# Patient Record
Sex: Female | Born: 1965 | Race: Black or African American | Hispanic: No | Marital: Single | Smoking: Never smoker
Health system: Southern US, Community
[De-identification: ages and names within clinical notes are randomized; demographics above are authoritative.]

## PROBLEM LIST (undated history)

## (undated) DIAGNOSIS — E119 Type 2 diabetes mellitus without complications: Secondary | ICD-10-CM

## (undated) DIAGNOSIS — E785 Hyperlipidemia, unspecified: Secondary | ICD-10-CM

## (undated) DIAGNOSIS — Z85528 Personal history of other malignant neoplasm of kidney: Secondary | ICD-10-CM

## (undated) DIAGNOSIS — N182 Chronic kidney disease, stage 2 (mild): Secondary | ICD-10-CM

## (undated) DIAGNOSIS — I10 Essential (primary) hypertension: Secondary | ICD-10-CM

## (undated) DIAGNOSIS — M1990 Unspecified osteoarthritis, unspecified site: Secondary | ICD-10-CM

## (undated) HISTORY — DX: Chronic kidney disease, stage 2 (mild): N18.2

## (undated) HISTORY — PX: ABDOMINAL HYSTERECTOMY: SHX81

## (undated) HISTORY — DX: Hyperlipidemia, unspecified: E78.5

## (undated) HISTORY — PX: CARPAL TUNNEL RELEASE: SHX101

## (undated) HISTORY — DX: Personal history of other malignant neoplasm of kidney: Z85.528

---

## 2007-12-02 ENCOUNTER — Ambulatory Visit (HOSPITAL_COMMUNITY): Admission: RE | Admit: 2007-12-02 | Discharge: 2007-12-02 | Payer: Self-pay | Admitting: Family Medicine

## 2011-12-09 ENCOUNTER — Other Ambulatory Visit (HOSPITAL_COMMUNITY): Payer: Self-pay | Admitting: Physician Assistant

## 2011-12-09 DIAGNOSIS — Z139 Encounter for screening, unspecified: Secondary | ICD-10-CM

## 2011-12-30 ENCOUNTER — Ambulatory Visit (HOSPITAL_COMMUNITY)
Admission: RE | Admit: 2011-12-30 | Discharge: 2011-12-30 | Disposition: A | Discharge status: Home / Self Care | Payer: Self-pay | Source: Ambulatory Visit | Attending: Physician Assistant | Admitting: Physician Assistant

## 2011-12-30 DIAGNOSIS — Z139 Encounter for screening, unspecified: Secondary | ICD-10-CM

## 2012-01-03 ENCOUNTER — Other Ambulatory Visit: Payer: Self-pay | Admitting: Physician Assistant

## 2012-01-03 DIAGNOSIS — R928 Other abnormal and inconclusive findings on diagnostic imaging of breast: Secondary | ICD-10-CM

## 2012-01-24 ENCOUNTER — Other Ambulatory Visit (HOSPITAL_COMMUNITY): Payer: Self-pay | Admitting: Nurse Practitioner

## 2012-01-24 ENCOUNTER — Ambulatory Visit (HOSPITAL_COMMUNITY)
Admission: RE | Admit: 2012-01-24 | Discharge: 2012-01-24 | Disposition: A | Discharge status: Home / Self Care | Payer: PRIVATE HEALTH INSURANCE | Source: Ambulatory Visit | Attending: Physician Assistant | Admitting: Physician Assistant

## 2012-01-24 ENCOUNTER — Ambulatory Visit (HOSPITAL_COMMUNITY)
Admission: RE | Admit: 2012-01-24 | Discharge: 2012-01-24 | Disposition: A | Discharge status: Home / Self Care | Payer: PRIVATE HEALTH INSURANCE | Source: Ambulatory Visit | Attending: Family Medicine | Admitting: Family Medicine

## 2012-01-24 DIAGNOSIS — R928 Other abnormal and inconclusive findings on diagnostic imaging of breast: Secondary | ICD-10-CM

## 2012-01-24 DIAGNOSIS — N6009 Solitary cyst of unspecified breast: Secondary | ICD-10-CM | POA: Insufficient documentation

## 2012-02-05 ENCOUNTER — Encounter (HOSPITAL_COMMUNITY): Payer: Self-pay

## 2013-01-08 IMAGING — MG MM DIGITAL DIAGNOSTIC UNILAT L
2 series · 2 of 2 positions shown · non-contrast
Comparison: With prior mammograms dated 12/30/2011 and 12/02/2007

CLINICAL DATA: Abnormal left screening mammogram

DIGITAL DIAGNOSTIC LEFT MAMMOGRAM  AND LEFT BREAST ULTRASOUND:

[L CC]
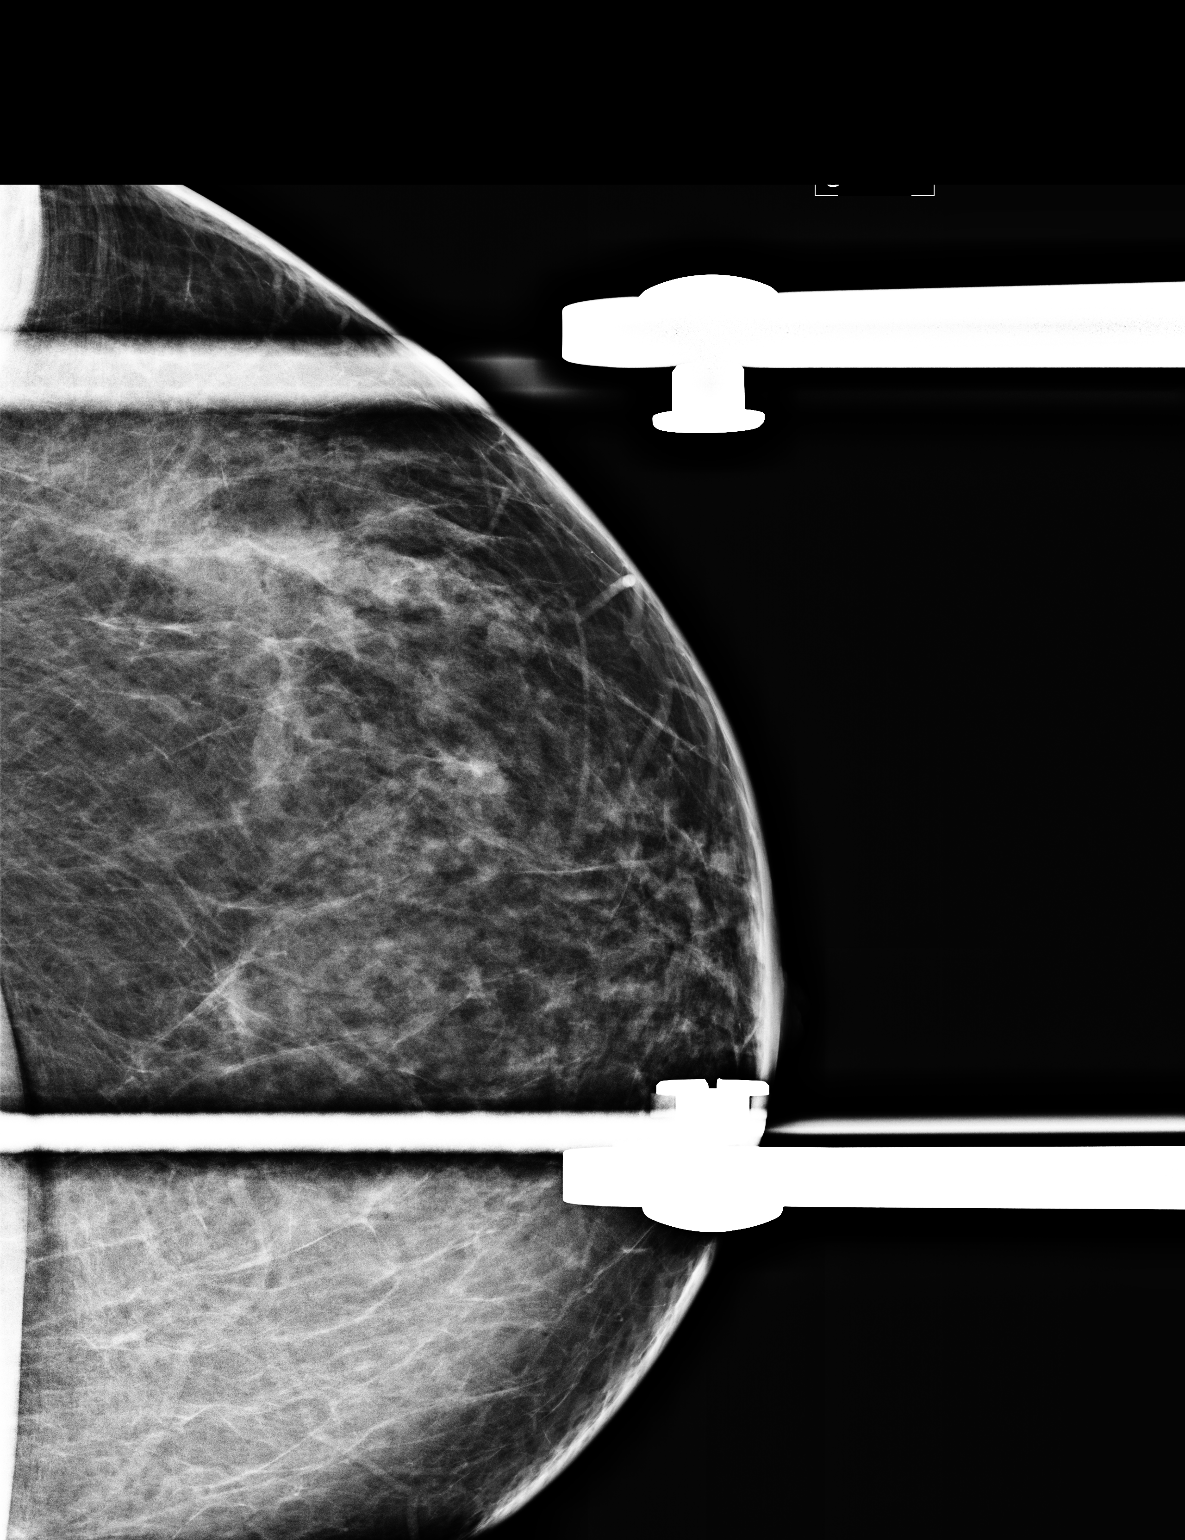

[L MLO]
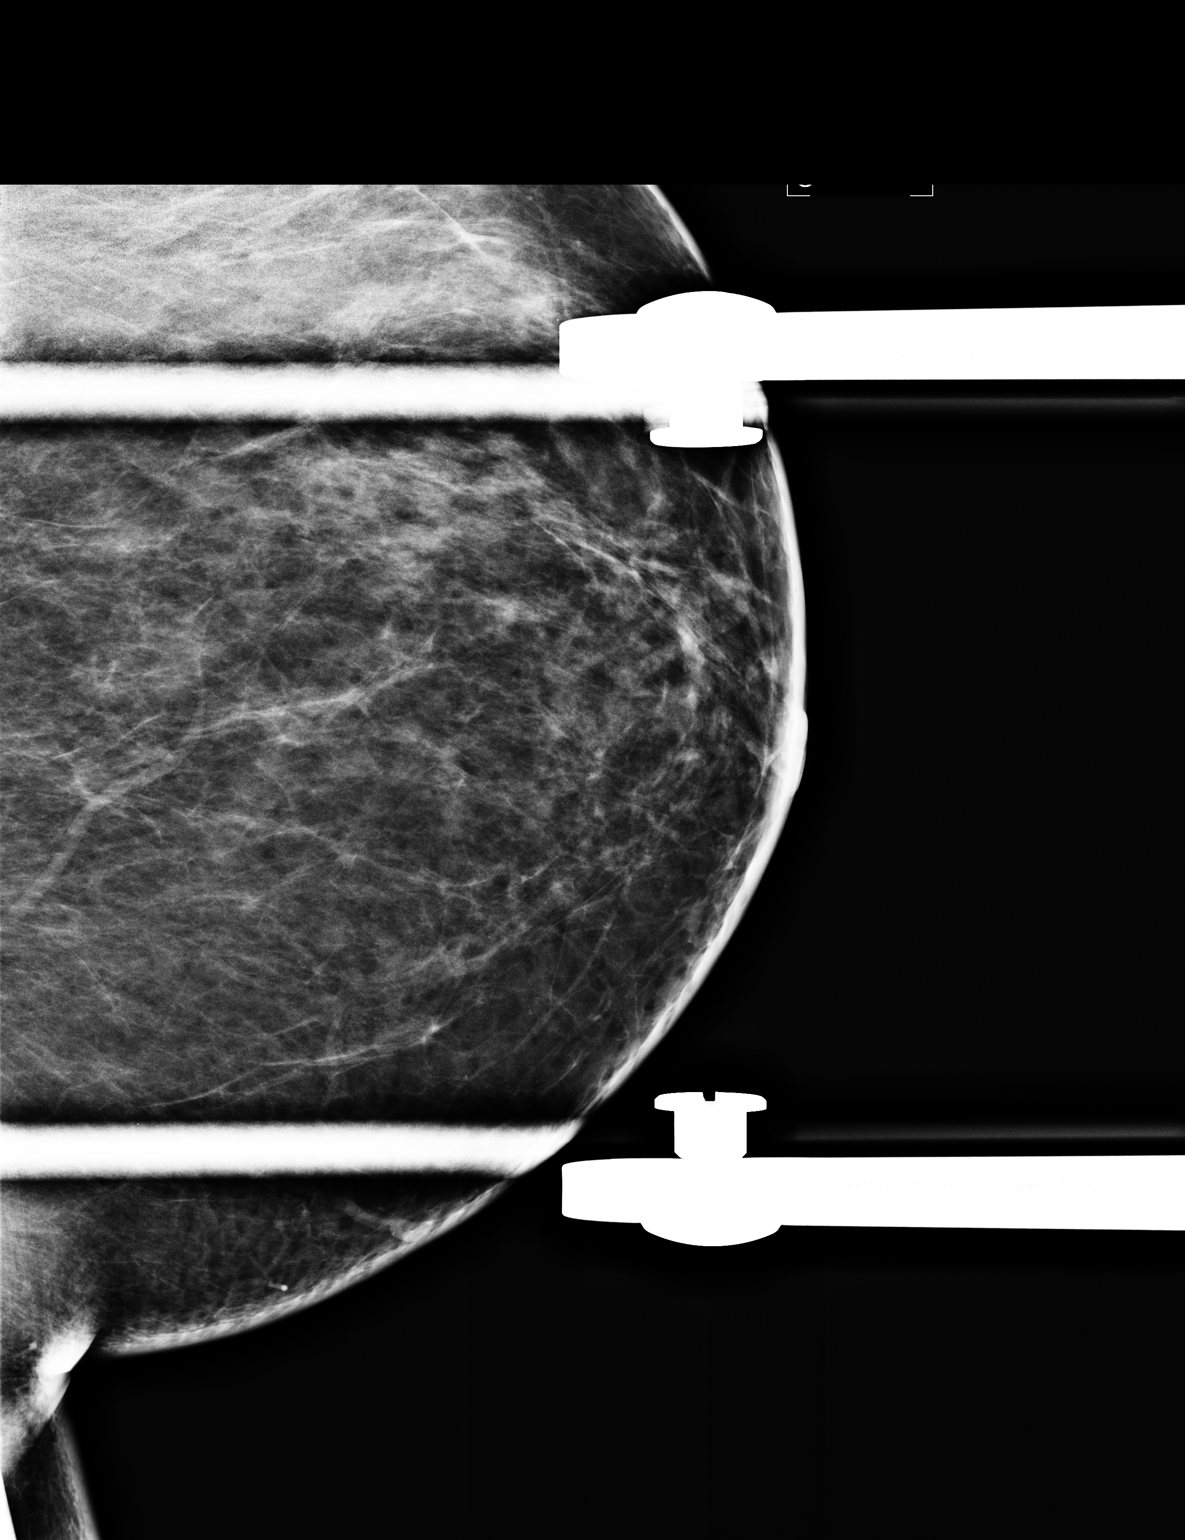

[2 of 2 positions shown; findings below may reference images not displayed]

FINDINGS: ACR Breast Density Category 2: There is a scattered fibroglandular
pattern.

Spot compression views of the lateral portion of the left breast
were obtained.  There is no persistent mass, distortion or
malignant-type microcalcifications.

On physical exam, I do not palpate a mass in the left breast.

Ultrasound is performed, showing there is a simple cyst in the left
breast at 9 o'clock 7 cm from the nipple measuring 5 x 4 x 7 mm.
There is no solid mass.
IMPRESSION: Benign-appearing cyst in the left breast.  No evidence of
malignancy.

RECOMMENDATION:
Bilateral screening mammogram in 1 year is recommended.

I have discussed the findings and recommendations with the patient.
Results were also provided in writing at the conclusion of the
visit.

BI-RADS CATEGORY 2:  Benign finding(s).

## 2014-04-13 ENCOUNTER — Encounter (HOSPITAL_COMMUNITY): Payer: Self-pay | Admitting: *Deleted

## 2014-04-13 ENCOUNTER — Emergency Department (HOSPITAL_COMMUNITY)
Admission: EM | Admit: 2014-04-13 | Discharge: 2014-04-13 | Disposition: A | Discharge status: Home / Self Care | Payer: Self-pay | Attending: Emergency Medicine | Admitting: Emergency Medicine

## 2014-04-13 DIAGNOSIS — I10 Essential (primary) hypertension: Secondary | ICD-10-CM | POA: Insufficient documentation

## 2014-04-13 DIAGNOSIS — R739 Hyperglycemia, unspecified: Secondary | ICD-10-CM

## 2014-04-13 DIAGNOSIS — R42 Dizziness and giddiness: Secondary | ICD-10-CM | POA: Insufficient documentation

## 2014-04-13 DIAGNOSIS — E1165 Type 2 diabetes mellitus with hyperglycemia: Secondary | ICD-10-CM | POA: Insufficient documentation

## 2014-04-13 HISTORY — DX: Type 2 diabetes mellitus without complications: E11.9

## 2014-04-13 HISTORY — DX: Essential (primary) hypertension: I10

## 2014-04-13 LAB — BASIC METABOLIC PANEL
Anion gap: 5 (ref 5–15)
BUN: 13 mg/dL (ref 6–23)
CO2: 26 mmol/L (ref 19–32)
Calcium: 9.4 mg/dL (ref 8.4–10.5)
Chloride: 104 mmol/L (ref 96–112)
Creatinine, Ser: 0.8 mg/dL (ref 0.50–1.10)
GFR calc Af Amer: >90 mL/min (ref >90)
GFR calc non Af Amer: 85 mL/min — ABNORMAL LOW (ref >90)
Glucose, Bld: 251 mg/dL — ABNORMAL HIGH (ref 70–99)
Potassium: 3.8 mmol/L (ref 3.5–5.1)
Sodium: 135 mmol/L (ref 135–145)

## 2014-04-13 LAB — CBC
HCT: 36.1 % (ref 36.0–46.0)
Hemoglobin: 11.7 g/dL — ABNORMAL LOW (ref 12.0–15.0)
MCH: 26.8 pg (ref 26.0–34.0)
MCHC: 32.4 g/dL (ref 30.0–36.0)
MCV: 82.8 fL (ref 78.0–100.0)
Platelets: 192 10*3/uL (ref 150–400)
RBC: 4.36 MIL/uL (ref 3.87–5.11)
RDW: 13.9 % (ref 11.5–15.5)
WBC: 7.3 10*3/uL (ref 4.0–10.5)

## 2014-04-13 MED ORDER — LORAZEPAM 1 MG PO TABS
1.0000 mg | ORAL_TABLET | Freq: Once | ORAL | Status: AC
  Administered 2014-04-13: 1 mg via ORAL
  Filled 2014-04-13: qty 1

## 2014-04-13 MED ORDER — SODIUM CHLORIDE 0.9 % IV BOLUS (SEPSIS)
1000.0000 mL | Freq: Once | INTRAVENOUS | Status: AC
  Administered 2014-04-13: 1000 mL via INTRAVENOUS

## 2014-04-13 MED ORDER — LORAZEPAM 1 MG PO TABS: 1.0000 mg | ORAL_TABLET | Freq: Once | ORAL | Status: DC

## 2014-04-13 MED ORDER — MECLIZINE HCL 25 MG PO TABS: 25.0000 mg | ORAL_TABLET | Freq: Three times a day (TID) | ORAL | Status: DC | PRN

## 2014-04-13 MED ORDER — MECLIZINE HCL 12.5 MG PO TABS
25.0000 mg | ORAL_TABLET | Freq: Once | ORAL | Status: AC
  Administered 2014-04-13: 25 mg via ORAL
  Filled 2014-04-13: qty 2

## 2014-04-13 MED ORDER — ONDANSETRON 8 MG PO TBDP: 8.0000 mg | ORAL_TABLET | Freq: Three times a day (TID) | ORAL | Status: DC | PRN

## 2014-04-13 MED ORDER — KETOROLAC TROMETHAMINE 30 MG/ML IJ SOLN
30.0000 mg | Freq: Once | INTRAMUSCULAR | Status: AC
  Administered 2014-04-13: 30 mg via INTRAVENOUS
  Filled 2014-04-13: qty 1

## 2014-04-13 NOTE — Discharge Instructions (Signed)

## 2014-04-13 NOTE — ED Notes (Signed)
Pt c/o headache that started around 2000 on 3/15 and dizziness that started around 2am today; pt denies any other symptoms

## 2014-04-13 NOTE — ED Provider Notes (Signed)
CSN: [id number]     Arrival date & time 04/13/14  2778 History   First MD Initiated Contact with Patient 04/13/14 [phone]     Chief Complaint  Patient presents with  . Dizziness      HPI Patient ports sensation of dizziness. She describes his birth as a lightheadedness as well as a sensation of the room spinning around. She reports nausea without vomiting. She denies diarrhea. She reports mild decreased oral intake over the past 24-48 hours. She denies melena or hematochezia. Her symptoms are mild in severity. Her symptoms are worsened by rapid movements of her head. No other complaints. She did report a headache to nursing staff however at this time she reports no significant headache. She denies weakness of her arms or legs. No recent injury or trauma. No recent head injury. She states she felt like she was in her normal state of health when she went to bed. Had symptoms like this before.   Past Medical History  Diagnosis Date  . Diabetes mellitus without complication   . Hypertension    Past Surgical History  Procedure Laterality Date  . Abdominal hysterectomy    . Carpal tunnel release Left    History reviewed. No pertinent family history. History  Substance Use Topics  . Smoking status: Never Smoker   . Smokeless tobacco: Not on file  . Alcohol Use: No   OB History    No data available     Review of Systems  All other systems reviewed and are negative.     Allergies  Lisinopril  Home Medications   Prior to Admission medications   Not on File   BP 161/64 mmHg  Pulse 60  Temp(Src) 98.8 F (37.1 C) (Oral)  Resp 22  Ht 5\' 8"  (1.727 m)  Wt 257 lb (116.574 kg)  BMI 39.09 kg/m2  SpO2 100% Physical Exam  Constitutional: She is oriented to person, place, and time. She appears well-developed and well-nourished. No distress.  HENT:  Head: Normocephalic and atraumatic.  Eyes: EOM are normal. Pupils are equal, round, and reactive to light.  Neck: Normal range of  motion.  Cardiovascular: Normal rate, regular rhythm and normal heart sounds.   Pulmonary/Chest: Effort normal and breath sounds normal.  Abdominal: Soft. She exhibits no distension. There is no tenderness.  Musculoskeletal: Normal range of motion.  Neurological: She is alert and oriented to person, place, and time.  5/5 strength in major muscle groups of  bilateral upper and lower extremities. Speech normal. No facial asymetry.   Skin: Skin is warm and dry.  Psychiatric: She has a normal mood and affect. Judgment normal.  Nursing note and vitals reviewed.   ED Course  Procedures (including critical care time) Labs Review Labs Reviewed  CBC - Abnormal; Notable for the following:    Hemoglobin 11.7 (*)    All other components within normal limits  BASIC METABOLIC PANEL - Abnormal; Notable for the following:    Glucose, Bld 251 (*)    GFR calc non Af Amer 85 (*)    All other components within normal limits    Imaging Review No results found.   EKG Interpretation None      MDM   Final diagnoses:  None    5:17 AM Patient feels much better at this time. Discharged home in good condition. Suspect peripheral vertigo. Home with meclizine.    Jola Schmidt, MD 04/13/14 [phone]

## 2014-09-07 ENCOUNTER — Other Ambulatory Visit (HOSPITAL_COMMUNITY): Payer: Self-pay | Admitting: Family

## 2014-09-07 DIAGNOSIS — E010 Iodine-deficiency related diffuse (endemic) goiter: Secondary | ICD-10-CM

## 2014-09-12 ENCOUNTER — Ambulatory Visit (HOSPITAL_COMMUNITY)
Admission: RE | Admit: 2014-09-12 | Discharge: 2014-09-12 | Disposition: A | Discharge status: Home / Self Care | Payer: Self-pay | Source: Ambulatory Visit | Attending: Family | Admitting: Family

## 2014-09-12 DIAGNOSIS — E010 Iodine-deficiency related diffuse (endemic) goiter: Secondary | ICD-10-CM

## 2014-09-12 DIAGNOSIS — E049 Nontoxic goiter, unspecified: Secondary | ICD-10-CM | POA: Insufficient documentation

## 2015-03-02 LAB — HEMOGLOBIN A1C: Hemoglobin A1C: 11.2

## 2015-03-23 ENCOUNTER — Ambulatory Visit (INDEPENDENT_AMBULATORY_CARE_PROVIDER_SITE_OTHER): Payer: BLUE CROSS/BLUE SHIELD | Admitting: "Endocrinology

## 2015-03-23 ENCOUNTER — Other Ambulatory Visit: Payer: Self-pay

## 2015-03-23 ENCOUNTER — Encounter: Payer: Self-pay | Admitting: "Endocrinology

## 2015-03-23 VITALS — BP 136/82 | HR 63 | Ht 68.0 in | Wt 242.0 lb

## 2015-03-23 DIAGNOSIS — I10 Essential (primary) hypertension: Secondary | ICD-10-CM | POA: Diagnosis not present

## 2015-03-23 DIAGNOSIS — Z794 Long term (current) use of insulin: Secondary | ICD-10-CM

## 2015-03-23 DIAGNOSIS — E1165 Type 2 diabetes mellitus with hyperglycemia: Secondary | ICD-10-CM | POA: Diagnosis not present

## 2015-03-23 DIAGNOSIS — IMO0001 Reserved for inherently not codable concepts without codable children: Secondary | ICD-10-CM

## 2015-03-23 MED ORDER — GLUCOSE BLOOD VI STRP: ORAL_STRIP | Status: DC

## 2015-03-23 MED ORDER — DAPAGLIFLOZIN PROPANEDIOL 5 MG PO TABS: 5.0000 mg | ORAL_TABLET | Freq: Every day | ORAL | Status: DC

## 2015-03-23 MED ORDER — BAYER CONTOUR MONITOR DEVI: Status: DC

## 2015-03-23 MED ORDER — INSULIN DETEMIR 100 UNIT/ML FLEXPEN
20.0000 [IU] | PEN_INJECTOR | Freq: Every day | SUBCUTANEOUS | Status: DC
Start: 2015-03-23 — End: 2015-04-26

## 2015-03-23 MED ORDER — LANCETS ULTRA FINE MISC: Status: DC

## 2015-03-23 MED ORDER — METFORMIN HCL 1000 MG PO TABS: 1000.0000 mg | ORAL_TABLET | Freq: Two times a day (BID) | ORAL | Status: DC

## 2015-03-23 MED ORDER — GLUCOSE BLOOD VI STRP
ORAL_STRIP | Status: DC
Start: 2015-03-23 — End: 2015-05-24

## 2015-03-23 NOTE — Patient Instructions (Signed)

## 2015-03-23 NOTE — Progress Notes (Signed)
Subjective:    Patient ID: Sandra Terrell, female    DOB: 1965-06-23. Patient is being seen in consultation for management of diabetes requested by Dr. Sylvester Harder.   Past Medical History  Diagnosis Date  . Diabetes mellitus without complication ([place])   . Hypertension    Past Surgical History  Procedure Laterality Date  . Abdominal hysterectomy    . Carpal tunnel release Left    Social History   Social History  . Marital Status: Single    Spouse Name: N/A  . Number of Children: N/A  . Years of Education: N/A   Social History Main Topics  . Smoking status: Never Smoker   . Smokeless tobacco: None  . Alcohol Use: No  . Drug Use: No  . Sexual Activity: Yes    Birth Control/ Protection: Surgical   Other Topics Concern  . None   Social History Narrative   Outpatient Encounter Prescriptions as of 03/23/2015  Medication Sig  . aspirin 81 MG tablet Take 81 mg by mouth daily.  Marland Kitchen atenolol (TENORMIN) 100 MG tablet Take 100 mg by mouth daily.  Marland Kitchen diltiazem (CARDIZEM) 60 MG tablet Take 60 mg by mouth daily.  . hydrochlorothiazide (HYDRODIURIL) 25 MG tablet Take 25 mg by mouth daily.  . metFORMIN (GLUCOPHAGE) 1000 MG tablet Take 1 tablet (1,000 mg total) by mouth 2 (two) times daily with a meal.  . Omega-3 Fatty Acids (FISH OIL) 1000 MG CAPS Take by mouth 3 (three) times daily.  . [DISCONTINUED] metFORMIN (GLUCOPHAGE) 1000 MG tablet Take 1,000 mg by mouth 2 (two) times daily with a meal.  . dapagliflozin propanediol (FARXIGA) 5 MG TABS tablet Take 5 mg by mouth daily.  Marland Kitchen glucose blood (ONE TOUCH TEST STRIPS) test strip Use as instructed  . Insulin Detemir (LEVEMIR FLEXTOUCH) 100 UNIT/ML Pen Inject 20 Units into the skin daily at 10 pm.  . [DISCONTINUED] Insulin Detemir (LEVEMIR FLEXTOUCH) 100 UNIT/ML Pen Inject 20 Units into the skin every morning.  . [DISCONTINUED] meclizine (ANTIVERT) 25 MG tablet Take 1 tablet (25 mg total) by mouth 3 (three) times daily as needed for  dizziness.  . [DISCONTINUED] ondansetron (ZOFRAN ODT) 8 MG disintegrating tablet Take 1 tablet (8 mg total) by mouth every 8 (eight) hours as needed for nausea or vomiting.   No facility-administered encounter medications on file as of 03/23/2015.   ALLERGIES: Allergies  Allergen Reactions  . Lisinopril Swelling    angioedema   VACCINATION STATUS:  There is no immunization history on file for this patient.  Diabetes She presents for her initial diabetic visit. She has type 2 diabetes mellitus. Onset time: She was diagnosed at approximate age of 22 years. Her disease course has been worsening. There are no hypoglycemic associated symptoms. Pertinent negatives for hypoglycemia include no confusion, headaches, pallor or seizures. Associated symptoms include fatigue, polydipsia, polyuria and visual change. Pertinent negatives for diabetes include no chest pain and no polyphagia. There are no hypoglycemic complications. Symptoms are worsening. There are no diabetic complications. Risk factors for coronary artery disease include diabetes mellitus, dyslipidemia, obesity, hypertension, family history and sedentary lifestyle. Current diabetic treatment includes oral agent (monotherapy) and insulin injections. Her weight is increasing steadily. She is following a generally unhealthy diet. When asked about meal planning, she reported none. She has not had a previous visit with a dietitian. She never participates in exercise. Home blood sugar record trend: She did not bring any meter nor log to review today. She admits  to not monitoring blood glucose regularly. An ACE inhibitor/angiotensin II receptor blocker is not being taken.  Hypertension This is a chronic problem. The current episode started more than 1 year ago. Pertinent negatives include no chest pain, headaches, palpitations or shortness of breath. Risk factors for coronary artery disease include diabetes mellitus, obesity, sedentary lifestyle and  family history. Past treatments include diuretics.       Review of Systems  Constitutional: Positive for fatigue. Negative for fever, chills and unexpected weight change.  HENT: Negative for trouble swallowing and voice change.   Eyes: Negative for visual disturbance.  Respiratory: Negative for cough, shortness of breath and wheezing.   Cardiovascular: Negative for chest pain, palpitations and leg swelling.  Gastrointestinal: Negative for nausea, vomiting and diarrhea.  Endocrine: Positive for polydipsia and polyuria. Negative for cold intolerance, heat intolerance and polyphagia.  Musculoskeletal: Negative for myalgias and arthralgias.  Skin: Negative for color change, pallor, rash and wound.  Neurological: Negative for seizures and headaches.  Psychiatric/Behavioral: Negative for suicidal ideas and confusion.    Objective:    BP 136/82 mmHg  Pulse 63  Ht 5\' 8"  (1.727 m)  Wt 242 lb (109.77 kg)  BMI 36.80 kg/m2  SpO2 99%  Wt Readings from Last 3 Encounters:  03/23/15 242 lb (109.77 kg)  04/13/14 257 lb (116.574 kg)    Physical Exam  Constitutional: She is oriented to person, place, and time. She appears well-developed.  HENT:  Head: Normocephalic and atraumatic.  Eyes: EOM are normal.  Neck: Normal range of motion. Neck supple. No tracheal deviation present. No thyromegaly present.  Cardiovascular: Normal rate and regular rhythm.   Pulmonary/Chest: Effort normal and breath sounds normal.  Abdominal: Soft. Bowel sounds are normal. There is no tenderness. There is no guarding.  Musculoskeletal: Normal range of motion. She exhibits no edema.  Neurological: She is alert and oriented to person, place, and time. She has normal reflexes. No cranial nerve deficit. Coordination normal.  Skin: Skin is warm and dry. No rash noted. No erythema. No pallor.  Psychiatric: She has a normal mood and affect. Judgment normal.    CMP     Component Value Date/Time   NA 135 04/13/2014  0415   K 3.8 04/13/2014 0415   CL 104 04/13/2014 0415   CO2 26 04/13/2014 0415   GLUCOSE 251* 04/13/2014 0415   BUN 13 04/13/2014 0415   CREATININE 0.80 04/13/2014 0415   CALCIUM 9.4 04/13/2014 0415   GFRNONAA 85* 04/13/2014 0415   GFRAA >90 04/13/2014 0415      Assessment & Plan:   1. Uncontrolled type 2 diabetes mellitus without complication, with long-term current use of insulin ([place])  - Patient has currently uncontrolled symptomatic type 2 DM since  50 years of age,  with most recent A1c of 11.2 %. Recent labs reviewed, showing normal renal function on 02/27/2015.   - patient remains at a high risk for more acute and chronic complications of diabetes which include CAD, CVA, CKD, retinopathy, and neuropathy. These are all discussed in detail with the patient.  - I have counseled the patient on diet management and weight loss, by adopting a carbohydrate restricted/protein rich diet.  - Suggestion is made for patient to avoid simple carbohydrates   from their diet including Cakes , Desserts, Ice Cream,  Soda (  diet and regular) , Sweet Tea , Candies,  Chips, Cookies, Artificial Sweeteners,   and "Sugar-free" Products . This will help patient to have stable blood glucose  profile and potentially avoid unintended weight gain.  - I encouraged the patient to switch to  unprocessed or minimally processed complex starch and increased protein intake (animal or plant source), fruits, and vegetables.  - Patient is advised to stick to a routine mealtimes to eat 3 meals  a day and avoid unnecessary snacks ( to snack only to correct hypoglycemia).  - The patient will be scheduled with Jearld Fenton, RDN, CDE for individualized DM education.  - I have approached patient with the following individualized plan to manage diabetes and patient agrees:   - I  will proceed to readjust  Her basal insulin Levemir 20 units QHS, associated with strict monitoring of glucose  AC and HS. -She may need  prandial insulin based on her commitment and blood glucose readings when she returns in one week. -Patient is encouraged to call clinic for blood glucose levels less than 70 or above 300 mg /dl.  - I will contmetformin 1000 mg by mouth twice a day, therapeutically suitable for patient. -I will add  Farxiga 5 mg by mouth every morning with breakfast, side effects and precautions discussed with patient . - Patient will be considered for incretin therapy as appropriate next visit. - Patient specific target  A1c;  LDL, HDL, Triglycerides, and  Waist Circumference were discussed in detail.  2) BP/HTN:controlled. Continue current medications, will consider ACEI/ARB ON SUBSEQUENT VISITS IF SHE NEEDS MORE THERAPY.  3) Lipids/HPL:  and lipid panel unknown, she will be considered for low-dose statin therapy on subsequent visits. 4)  Weight/Diet: CDE Consult will be initiated , exercise, and detailed carbohydrates information provided.  5) Chronic Care/Health Maintenance:  -Patient is encouraged to continue to follow up with Ophthalmology, Podiatrist at least yearly or according to recommendations, and advised to   stay away from smoking. I have recommended yearly flu vaccine and pneumonia vaccination at least every 5 years; moderate intensity exercise for up to 150 minutes weekly; and  sleep for at least 7 hours a day.  - 60 minutes of time was spent on the care of this patient , 50% of which was applied for counseling on diabetes complications and their preventions.  - Patient to bring meter and  blood glucose logs during their next visit.   - I advised patient to maintain close follow up with No PCP Per Patient for primary care needs.  Follow up plan: - Return in about 1 week (around 03/30/2015) for diabetes, high blood pressure, high cholesterol, follow up with pre-visit labs, meter, and logs.  Glade Lloyd, MD Phone: [phone]  Fax: [phone]   03/23/2015, 10:50 AM

## 2015-03-29 ENCOUNTER — Other Ambulatory Visit: Payer: Self-pay

## 2015-03-29 MED ORDER — INSULIN GLARGINE 100 UNIT/ML SOLOSTAR PEN: 20.0000 [IU] | PEN_INJECTOR | Freq: Every day | SUBCUTANEOUS | Status: DC

## 2015-03-30 ENCOUNTER — Other Ambulatory Visit: Payer: Self-pay

## 2015-03-30 MED ORDER — INSULIN GLARGINE 100 UNIT/ML ~~LOC~~ SOLN: 20.0000 [IU] | Freq: Every day | SUBCUTANEOUS | Status: DC

## 2015-04-03 ENCOUNTER — Ambulatory Visit: Payer: BLUE CROSS/BLUE SHIELD | Admitting: "Endocrinology

## 2015-04-06 ENCOUNTER — Other Ambulatory Visit: Payer: Self-pay | Admitting: "Endocrinology

## 2015-04-06 ENCOUNTER — Telehealth: Payer: Self-pay | Admitting: "Endocrinology

## 2015-04-06 NOTE — Telephone Encounter (Signed)
Insulin is still $100 - she said the pharmacy said call in a generic order

## 2015-04-06 NOTE — Telephone Encounter (Signed)
Patient needs to come in for a visit to switch her to a different kind of insulin. In the meantime she can stay on the oral medications.

## 2015-04-06 NOTE — Telephone Encounter (Signed)
Pt states the lantus is too expensive.

## 2015-04-07 NOTE — Telephone Encounter (Signed)
Left message notifying pt to stay on Metformin & Wilder Glade. Then to call here to schedule appt.

## 2015-04-26 ENCOUNTER — Encounter: Payer: BLUE CROSS/BLUE SHIELD | Attending: "Endocrinology | Admitting: Nutrition

## 2015-04-26 ENCOUNTER — Ambulatory Visit (INDEPENDENT_AMBULATORY_CARE_PROVIDER_SITE_OTHER): Payer: BLUE CROSS/BLUE SHIELD | Admitting: "Endocrinology

## 2015-04-26 ENCOUNTER — Encounter: Payer: Self-pay | Admitting: "Endocrinology

## 2015-04-26 VITALS — BP 143/85 | HR 64 | Ht 68.0 in | Wt 241.0 lb

## 2015-04-26 VITALS — Ht 68.0 in | Wt 241.0 lb

## 2015-04-26 DIAGNOSIS — E669 Obesity, unspecified: Secondary | ICD-10-CM

## 2015-04-26 DIAGNOSIS — E119 Type 2 diabetes mellitus without complications: Secondary | ICD-10-CM | POA: Diagnosis not present

## 2015-04-26 DIAGNOSIS — E1165 Type 2 diabetes mellitus with hyperglycemia: Secondary | ICD-10-CM | POA: Diagnosis not present

## 2015-04-26 DIAGNOSIS — I10 Essential (primary) hypertension: Secondary | ICD-10-CM

## 2015-04-26 DIAGNOSIS — E785 Hyperlipidemia, unspecified: Secondary | ICD-10-CM | POA: Diagnosis not present

## 2015-04-26 DIAGNOSIS — E118 Type 2 diabetes mellitus with unspecified complications: Secondary | ICD-10-CM

## 2015-04-26 DIAGNOSIS — IMO0001 Reserved for inherently not codable concepts without codable children: Secondary | ICD-10-CM

## 2015-04-26 DIAGNOSIS — Z794 Long term (current) use of insulin: Secondary | ICD-10-CM | POA: Insufficient documentation

## 2015-04-26 MED ORDER — DAPAGLIFLOZIN PROPANEDIOL 10 MG PO TABS: 10.0000 mg | ORAL_TABLET | Freq: Every day | ORAL | Status: DC

## 2015-04-26 NOTE — Progress Notes (Signed)
Subjective:    Patient ID: Sandra Terrell, female    DOB: [birth year]. Patient is being seen in  Follow-up for management of type 2 diabetes.   Past Medical History  Diagnosis Date  . Diabetes mellitus without complication ([place])   . Hypertension    Past Surgical History  Procedure Laterality Date  . Abdominal hysterectomy    . Carpal tunnel release Left    Social History   Social History  . Marital Status: Single    Spouse Name: N/A  . Number of Children: N/A  . Years of Education: N/A   Social History Main Topics  . Smoking status: Never Smoker   . Smokeless tobacco: None  . Alcohol Use: No  . Drug Use: No  . Sexual Activity: Yes    Birth Control/ Protection: Surgical   Other Topics Concern  . None   Social History Narrative   Outpatient Encounter Prescriptions as of 04/26/2015  Medication Sig  . aspirin 81 MG tablet Take 81 mg by mouth daily.  . metFORMIN (GLUCOPHAGE) 1000 MG tablet Take 1 tablet (1,000 mg total) by mouth 2 (two) times daily with a meal.  . Omega-3 Fatty Acids (FISH OIL) 1000 MG CAPS Take by mouth 3 (three) times daily.  . [DISCONTINUED] dapagliflozin propanediol (FARXIGA) 5 MG TABS tablet Take 5 mg by mouth daily.  Marland Kitchen atenolol (TENORMIN) 100 MG tablet Take 100 mg by mouth daily. Reported on 04/26/2015  . Blood Glucose Monitoring Suppl (CONTOUR BLOOD GLUCOSE SYSTEM) DEVI Use 4 x daily. E11.65  . dapagliflozin propanediol (FARXIGA) 10 MG TABS tablet Take 10 mg by mouth daily.  Marland Kitchen diltiazem (CARDIZEM) 60 MG tablet Take 60 mg by mouth daily. Reported on 04/26/2015  . glucose blood (BAYER CONTOUR TEST) test strip Use as instructed 4 x daily. E11.65  . hydrochlorothiazide (HYDRODIURIL) 25 MG tablet Take 25 mg by mouth daily. Reported on 04/26/2015  . LANCETS ULTRA FINE MISC Use 4 x daily  . [DISCONTINUED] Insulin Detemir (LEVEMIR FLEXTOUCH) 100 UNIT/ML Pen Inject 20 Units into the skin daily at 10 pm.  . [DISCONTINUED] insulin glargine (LANTUS) 100 UNIT/ML  injection Inject 0.2 mLs (20 Units total) into the skin at bedtime.   No facility-administered encounter medications on file as of 04/26/2015.   ALLERGIES: Allergies  Allergen Reactions  . Lisinopril Swelling    angioedema   VACCINATION STATUS:  There is no immunization history on file for this patient.  Diabetes She presents for her initial diabetic visit. She has type 2 diabetes mellitus. Onset time: She was diagnosed at approximate age of 50 years. Her disease course has been improving. There are no hypoglycemic associated symptoms. Pertinent negatives for hypoglycemia include no confusion, headaches, pallor or seizures. Associated symptoms include fatigue and visual change. Pertinent negatives for diabetes include no chest pain, no polydipsia, no polyphagia and no polyuria. There are no hypoglycemic complications. Symptoms are improving. There are no diabetic complications. Risk factors for coronary artery disease include diabetes mellitus, dyslipidemia, obesity, hypertension, family history and sedentary lifestyle. Current diabetic treatment includes oral agent (monotherapy) (She did not continue on Levemir. She is on farxiga and metformin. She tolerated farxiga very well.). Her weight is increasing steadily. She is following a generally unhealthy diet. When asked about meal planning, she reported none. She has not had a previous visit with a dietitian. She never participates in exercise. Home blood sugar record trend: She did not bring any meter nor log to review today. She admits to  not monitoring blood glucose regularly. Her breakfast blood glucose range is generally 140-180 mg/dl. Her dinner blood glucose range is generally 140-180 mg/dl. An ACE inhibitor/angiotensin II receptor blocker is not being taken.  Hypertension This is a chronic problem. The current episode started more than 1 year ago. Pertinent negatives include no chest pain, headaches, palpitations or shortness of breath. Risk  factors for coronary artery disease include diabetes mellitus, obesity, sedentary lifestyle and family history. Past treatments include diuretics.       Review of Systems  Constitutional: Positive for fatigue. Negative for fever, chills and unexpected weight change.  HENT: Negative for trouble swallowing and voice change.   Eyes: Negative for visual disturbance.  Respiratory: Negative for cough, shortness of breath and wheezing.   Cardiovascular: Negative for chest pain, palpitations and leg swelling.  Gastrointestinal: Negative for nausea, vomiting and diarrhea.  Endocrine: Negative for cold intolerance, heat intolerance, polydipsia, polyphagia and polyuria.  Musculoskeletal: Negative for myalgias and arthralgias.  Skin: Negative for color change, pallor, rash and wound.  Neurological: Negative for seizures and headaches.  Psychiatric/Behavioral: Negative for suicidal ideas and confusion.    Objective:    BP 143/85 mmHg  Pulse 64  Ht 5\' 8"  (1.727 m)  Wt 241 lb (109.317 kg)  BMI 36.65 kg/m2  SpO2 98%  Wt Readings from Last 3 Encounters:  04/26/15 241 lb (109.317 kg)  04/26/15 241 lb (109.317 kg)  03/23/15 242 lb (109.77 kg)    Physical Exam  Constitutional: She is oriented to person, place, and time. She appears well-developed.  HENT:  Head: Normocephalic and atraumatic.  Eyes: EOM are normal.  Neck: Normal range of motion. Neck supple. No tracheal deviation present. No thyromegaly present.  Cardiovascular: Normal rate and regular rhythm.   Pulmonary/Chest: Effort normal and breath sounds normal.  Abdominal: Soft. Bowel sounds are normal. There is no tenderness. There is no guarding.  Musculoskeletal: Normal range of motion. She exhibits no edema.  Neurological: She is alert and oriented to person, place, and time. She has normal reflexes. No cranial nerve deficit. Coordination normal.  Skin: Skin is warm and dry. No rash noted. No erythema. No pallor.  Psychiatric: She  has a normal mood and affect. Judgment normal.    CMP     Component Value Date/Time   NA 135 04/13/2014 0415   K 3.8 04/13/2014 0415   CL 104 04/13/2014 0415   CO2 26 04/13/2014 0415   GLUCOSE 251* 04/13/2014 0415   BUN 13 04/13/2014 0415   CREATININE 0.80 04/13/2014 0415   CALCIUM 9.4 04/13/2014 0415   GFRNONAA 85* 04/13/2014 0415   GFRAA >90 04/13/2014 0415      Assessment & Plan:   1. Uncontrolled type 2 diabetes mellitus without complication, with long-term current use of insulin ([place])  - Patient has currently uncontrolled symptomatic type 2 DM since  50 years of age,  with most recent A1c of 11.2 %. Recent labs reviewed, showing normal renal function on 02/27/2015.   - patient remains at a high risk for more acute and chronic complications of diabetes which include CAD, CVA, CKD, retinopathy, and neuropathy. These are all discussed in detail with the patient.  - I have counseled the patient on diet management and weight loss, by adopting a carbohydrate restricted/protein rich diet.  - Suggestion is made for patient to avoid simple carbohydrates   from their diet including Cakes , Desserts, Ice Cream,  Soda (  diet and regular) , Sweet Tea , Candies,  Chips, Cookies, Artificial Sweeteners,   and "Sugar-free" Products . This will help patient to have stable blood glucose profile and potentially avoid unintended weight gain.  - I encouraged the patient to switch to  unprocessed or minimally processed complex starch and increased protein intake (animal or plant source), fruits, and vegetables.  - Patient is advised to stick to a routine mealtimes to eat 3 meals  a day and avoid unnecessary snacks ( to snack only to correct hypoglycemia).  - The patient will be scheduled with Jearld Fenton, RDN, CDE for individualized DM education.  - I have approached patient with the following individualized plan to manage diabetes and patient agrees:   - She came with better blood glucose  readings despite the fact that she did not continue on Levemir.  -She would like to avoid insulin treatment for now.    - I will contmetformin 1000 mg by mouth twice a day, therapeutically suitable for patient. -I will increase   Wilder Glade  To 10 mg by mouth every morning with breakfast, side effects and precautions discussed with patient .  - Patient will be considered for incretin therapy as appropriate next visit. - Patient specific target  A1c;  LDL, HDL, Triglycerides, and  Waist Circumference were discussed in detail.  2) BP/HTN:controlled. Continue current medications, will consider ACEI/ARB ON SUBSEQUENT VISITS IF SHE NEEDS MORE THERAPY.  3) Lipids/HPL:  and lipid panel unknown, she will be considered for low-dose statin therapy on subsequent visits. 4)  Weight/Diet: CDE Consult will be initiated , exercise, and detailed carbohydrates information provided.  5) Chronic Care/Health Maintenance:  -Patient is encouraged to continue to follow up with Ophthalmology, Podiatrist at least yearly or according to recommendations, and advised to   stay away from smoking. I have recommended yearly flu vaccine and pneumonia vaccination at least every 5 years; moderate intensity exercise for up to 150 minutes weekly; and  sleep for at least 7 hours a day.  - 25 minutes of time was spent on the care of this patient , 50% of which was applied for counseling on diabetes complications and their preventions.  - Patient to bring meter and  blood glucose logs during their next visit.   - I advised patient to maintain close follow up with No PCP Per Patient for primary care needs.  Follow up plan: - Return in about 10 weeks (around 07/05/2015) for diabetes, high blood pressure, high cholesterol, follow up with pre-visit labs.  Glade Lloyd, MD Phone: [phone]  Fax: [phone]   04/26/2015, 4:38 PM

## 2015-04-26 NOTE — Patient Instructions (Signed)

## 2015-04-26 NOTE — Progress Notes (Signed)
  Medical Nutrition Therapy:  Appt start time: 1100 end time:  1200.  Assessment:  Primary concerns today: Diabetes Type 2..  A1C 11.2% Saw Dr. Dorris Fetch today . Works 3rd shift. She notes her blood sugars have been high. Been tired, thirsty and hungry often. Metformin 1000 mg BID. Doesn't want to go on insulin. Tried to use Levemir at one time but couldn't afford it with insurance.Currently on Metformin and [place]. Is confused about what to eat, how much and when to eat based on her work schedule.  Sometimes skips meals. Not exercising. Diet is inconsistent in CHO and meal planning effecting blood sugars and weight .  Lab Results  Component Value Date   HGBA1C 11.2 03/02/2015    Preferred Learning Style:  Auditory  Visual  Hands on  No preference indicated   Learning Readiness:   Ready  Change in progress   MEDICATIONS: See List   DIETARY INTAKE:  Eats 2-3 meals per day and snacks some due to third shift. Sometimes skips meals    Usual physical activity: ADL  Estimated energy needs: 1500 calories 170 g carbohydrates 112 g protein 42 g fat  Progress Towards Goal(s):  In progress.   Nutritional Diagnosis:  NB-1.1 Food and nutrition-related knowledge deficit As related to Diabetes.  As evidenced by A1C >11%.    Intervention: Nutrition and Diabetes education provided on My Plate, CHO counting, meal planning, portion sizes, timing of meals, avoiding snacks between meals unless having a low blood sugar, target ranges for A1C and blood sugars, signs/symptoms and treatment of hyper/hypoglycemia, monitoring blood sugars, taking medications as prescribed, benefits of exercising 30 minutes per day and prevention of complications of DM.  Goals 1. Follow My Plate Method 2. Eat meals on time at scheduled times on third shift as discussed. 3. Cut out snacks between meals 4. Exercise 30-60 minutes three times per week 5. Increase fresh fruits and vegetables. 6. Lose 1 lb per  week 7. Get A1C down to 8% in three months. 8. Check blood sugars as directed.  Teaching Method Utilized:  Visual Auditory Hands on  Handouts given during visit include:  The Plate Method   Meal Plan Card  Diabetes Instructions   Barriers to learning/adherence to lifestyle change:  None  Demonstrated degree of understanding via:  Teach Back   Monitoring/Evaluation:  Dietary intake, exercise,  Meal planning, SBG, and body weight in 1 month(s).

## 2015-04-27 ENCOUNTER — Encounter: Payer: Self-pay | Admitting: Nutrition

## 2015-04-28 NOTE — Patient Instructions (Signed)
Goals 1. Follow My Plate Method 2. Eat meals on time at scheduled times on third shift as discussed. 3. Cut out snacks between meals 4. Exercise 30-60 minutes three times per week 5. Increase fresh fruits and vegetables. 6. Lose 1 lb per week 7. Get A1C down to 8% in three months. 8. Check blood sugars as directe

## 2015-05-19 ENCOUNTER — Other Ambulatory Visit: Payer: Self-pay

## 2015-05-19 MED ORDER — LANCETS ULTRA FINE MISC: Status: DC

## 2015-05-24 ENCOUNTER — Other Ambulatory Visit: Payer: Self-pay

## 2015-05-24 MED ORDER — GLUCOSE BLOOD VI STRP: ORAL_STRIP | Status: DC

## 2015-05-26 ENCOUNTER — Ambulatory Visit: Payer: BLUE CROSS/BLUE SHIELD | Admitting: Nutrition

## 2015-07-07 ENCOUNTER — Ambulatory Visit: Payer: BLUE CROSS/BLUE SHIELD | Admitting: "Endocrinology

## 2015-07-07 ENCOUNTER — Other Ambulatory Visit: Payer: Self-pay | Admitting: Urology

## 2015-08-21 NOTE — Patient Instructions (Addendum)
ECHOE ALLY  08/21/2015   Your procedure is scheduled on: 08/25/15  Report to [facility]  Entrance take [place]  elevators to 3rd floor to  [place] at 1045 AM.  Call this number if you have problems the morning of surgery [phone]   Remember: ONLY 1 PERSON MAY GO WITH YOU TO SHORT STAY TO GET  READY MORNING OF [place].  Do not eat food AFTER MIDNIGHT [place] AM--THEN NOTHING BY MOUTH      Take these medicines the morning of surgery with A SIP OF WATER: NONE DO NOT TAKE ANY DIABETIC MEDICATIONS DAY OF YOUR SURGERY                               You may not have any metal on your body including hair pins and              piercings  Do not wear jewelry, make-up, lotions, powders or perfumes, deodorant             Do not wear nail polish.  Do not shave  48 hours prior to surgery.              Men may shave face and neck.   Do not bring valuables to the hospital. [place].  Contacts, dentures or bridgework may not be worn into surgery.  Leave suitcase in the car. After surgery it may be brought to your room.    CLEAR LIQUID DIET   Foods Allowed                                                                     Foods Excluded  Coffee and tea, regular and decaf                             liquids that you cannot  Plain Jell-O in any flavor                                             see through such as: Fruit ices (not with fruit pulp)                                     milk, soups, orange juice  Iced Popsicles                                    All solid food Carbonated beverages, regular and diet  Cranberry, grape and apple juices Sports drinks like Gatorade Lightly seasoned clear broth or consume(fat free) Sugar, honey syrup  Sample Menu Breakfast                                Lunch                                      Supper Cranberry juice                    Beef broth                            Chicken broth Jell-O                                     Grape juice                           Apple juice Coffee or tea                        Jell-O                                      Popsicle                                                Coffee or tea                        Coffee or tea  _____________________________________________________________________  ______________             [facility] - Preparing for Surgery Before surgery, you can play an important role.  Because skin is not sterile, your skin needs to be as free of germs as possible.  You can reduce the number of germs on your skin by washing with CHG (chlorahexidine gluconate) soap before surgery.  CHG is an antiseptic cleaner which kills germs and bonds with the skin to continue killing germs even after washing. Please DO NOT use if you have an allergy to CHG or antibacterial soaps.  If your skin becomes reddened/irritated stop using the CHG and inform your nurse when you arrive at Short Stay. Do not shave (including legs and underarms) for at least 48 hours prior to the first CHG shower.  You may shave your face/neck. Please follow these instructions carefully:  1.  Shower with CHG Soap the night before surgery and the  morning of Surgery.  2.  If you choose to wash your hair, wash your hair first as usual with your  normal  shampoo.  3.  After you shampoo, rinse your hair and body thoroughly to remove the  shampoo.                           4.  Use CHG as you would any other liquid soap.  You can  apply chg directly  to the skin and wash                       Gently with a scrungie or clean washcloth.  5.  Apply the CHG Soap to your body ONLY FROM THE NECK DOWN.   Do not use on face/ open                           Wound or open sores. Avoid contact with eyes, ears mouth and genitals (private parts).                       Wash face,  Genitals  (private parts) with your normal soap.             6.  Wash thoroughly, paying special attention to the area where your surgery  will be performed.  7.  Thoroughly rinse your body with warm water from the neck down.  8.  DO NOT shower/wash with your normal soap after using and rinsing off  the CHG Soap.                9.  Pat yourself dry with a clean towel.            10.  Wear clean pajamas.            11.  Place clean sheets on your bed the night of your first shower and do not  sleep with pets. Day of Surgery : Do not apply any lotions/deodorants the morning of surgery.  Please wear clean clothes to the hospital/surgery center.  FAILURE TO FOLLOW THESE INSTRUCTIONS MAY RESULT IN THE CANCELLATION OF YOUR SURGERY PATIENT SIGNATURE_________________________________  NURSE SIGNATURE__________________________________  ________________________________________________________________________

## 2015-08-22 ENCOUNTER — Encounter (HOSPITAL_COMMUNITY)
Admission: RE | Admit: 2015-08-22 | Discharge: 2015-08-22 | Disposition: A | Discharge status: Home / Self Care | Payer: BLUE CROSS/BLUE SHIELD | Source: Ambulatory Visit | Attending: Urology | Admitting: Urology

## 2015-08-22 ENCOUNTER — Encounter (HOSPITAL_COMMUNITY): Payer: Self-pay

## 2015-08-22 DIAGNOSIS — I10 Essential (primary) hypertension: Secondary | ICD-10-CM | POA: Insufficient documentation

## 2015-08-22 DIAGNOSIS — Z01818 Encounter for other preprocedural examination: Secondary | ICD-10-CM | POA: Insufficient documentation

## 2015-08-22 HISTORY — DX: Unspecified osteoarthritis, unspecified site: M19.90

## 2015-08-22 LAB — BASIC METABOLIC PANEL
Anion gap: 9 (ref 5–15)
BUN: 19 mg/dL (ref 6–20)
CO2: 22 mmol/L (ref 22–32)
Calcium: 9.1 mg/dL (ref 8.9–10.3)
Chloride: 105 mmol/L (ref 101–111)
Creatinine, Ser: 0.73 mg/dL (ref 0.44–1.00)
GFR calc Af Amer: >60 mL/min (ref >60)
GFR calc non Af Amer: >60 mL/min (ref >60)
Glucose, Bld: 199 mg/dL — ABNORMAL HIGH (ref 65–99)
Potassium: 3.8 mmol/L (ref 3.5–5.1)
Sodium: 136 mmol/L (ref 135–145)

## 2015-08-22 LAB — CBC
HCT: 39.5 % (ref 36.0–46.0)
Hemoglobin: 12.4 g/dL (ref 12.0–15.0)
MCH: 25.7 pg — ABNORMAL LOW (ref 26.0–34.0)
MCHC: 31.4 g/dL (ref 30.0–36.0)
MCV: 81.8 fL (ref 78.0–100.0)
Platelets: 219 10*3/uL (ref 150–400)
RBC: 4.83 MIL/uL (ref 3.87–5.11)
RDW: 14.5 % (ref 11.5–15.5)
WBC: 9.1 10*3/uL (ref 4.0–10.5)

## 2015-08-22 LAB — ABO/RH: ABO/RH(D): A POS

## 2015-08-22 NOTE — Progress Notes (Signed)
States has bowel prep instructions

## 2015-08-23 LAB — HEMOGLOBIN A1C
Hgb A1c MFr Bld: 9.7 % — ABNORMAL HIGH (ref 4.8–5.6)
Mean Plasma Glucose: 232 mg/dL

## 2015-08-25 ENCOUNTER — Inpatient Hospital Stay (HOSPITAL_COMMUNITY): Payer: BLUE CROSS/BLUE SHIELD | Admitting: Anesthesiology

## 2015-08-25 ENCOUNTER — Observation Stay (HOSPITAL_COMMUNITY)
Admission: RE | Admit: 2015-08-25 | Discharge: 2015-08-27 | DRG: 982 | Disposition: A | Discharge status: Home / Self Care | Payer: BLUE CROSS/BLUE SHIELD | Source: Ambulatory Visit | Attending: Urology | Admitting: Urology

## 2015-08-25 ENCOUNTER — Encounter (HOSPITAL_COMMUNITY)
Admission: RE | Disposition: A | Discharge status: Home / Self Care | Payer: Self-pay | Source: Ambulatory Visit | Attending: Urology

## 2015-08-25 ENCOUNTER — Encounter (HOSPITAL_COMMUNITY): Payer: Self-pay | Admitting: *Deleted

## 2015-08-25 DIAGNOSIS — I10 Essential (primary) hypertension: Secondary | ICD-10-CM | POA: Insufficient documentation

## 2015-08-25 DIAGNOSIS — Z7984 Long term (current) use of oral hypoglycemic drugs: Secondary | ICD-10-CM | POA: Diagnosis not present

## 2015-08-25 DIAGNOSIS — M1990 Unspecified osteoarthritis, unspecified site: Secondary | ICD-10-CM | POA: Insufficient documentation

## 2015-08-25 DIAGNOSIS — N2889 Other specified disorders of kidney and ureter: Secondary | ICD-10-CM | POA: Diagnosis present

## 2015-08-25 DIAGNOSIS — R509 Fever, unspecified: Secondary | ICD-10-CM | POA: Diagnosis not present

## 2015-08-25 DIAGNOSIS — D1771 Benign lipomatous neoplasm of kidney: Principal | ICD-10-CM | POA: Insufficient documentation

## 2015-08-25 DIAGNOSIS — Z79899 Other long term (current) drug therapy: Secondary | ICD-10-CM | POA: Insufficient documentation

## 2015-08-25 DIAGNOSIS — E119 Type 2 diabetes mellitus without complications: Secondary | ICD-10-CM | POA: Insufficient documentation

## 2015-08-25 DIAGNOSIS — Z7982 Long term (current) use of aspirin: Secondary | ICD-10-CM | POA: Diagnosis not present

## 2015-08-25 HISTORY — PX: ROBOTIC ASSITED PARTIAL NEPHRECTOMY: SHX6087

## 2015-08-25 LAB — BASIC METABOLIC PANEL
Anion gap: 9 (ref 5–15)
BUN: 10 mg/dL (ref 6–20)
CO2: 24 mmol/L (ref 22–32)
Calcium: 8.8 mg/dL — ABNORMAL LOW (ref 8.9–10.3)
Chloride: 106 mmol/L (ref 101–111)
Creatinine, Ser: 0.85 mg/dL (ref 0.44–1.00)
GFR calc Af Amer: >60 mL/min (ref >60)
GFR calc non Af Amer: >60 mL/min (ref >60)
Glucose, Bld: 283 mg/dL — ABNORMAL HIGH (ref 65–99)
Potassium: 3.9 mmol/L (ref 3.5–5.1)
Sodium: 139 mmol/L (ref 135–145)

## 2015-08-25 LAB — TYPE AND SCREEN
ABO/RH(D): A POS
Antibody Screen: NEGATIVE

## 2015-08-25 LAB — GLUCOSE, CAPILLARY
Glucose-Capillary: 218 mg/dL — ABNORMAL HIGH (ref 65–99)
Glucose-Capillary: 264 mg/dL — ABNORMAL HIGH (ref 65–99)
Glucose-Capillary: 240 mg/dL — ABNORMAL HIGH (ref 65–99)
Glucose-Capillary: 231 mg/dL — ABNORMAL HIGH (ref 65–99)

## 2015-08-25 LAB — HEMOGLOBIN AND HEMATOCRIT, BLOOD
HCT: 39.4 % (ref 36.0–46.0)
Hemoglobin: 12.6 g/dL (ref 12.0–15.0)

## 2015-08-25 SURGERY — NEPHRECTOMY, PARTIAL, ROBOT-ASSISTED
Anesthesia: General | Laterality: Right

## 2015-08-25 MED ORDER — ROCURONIUM BROMIDE 100 MG/10ML IV SOLN
INTRAVENOUS | Status: DC | PRN
  Administered 2015-08-25: 50 mg via INTRAVENOUS
  Administered 2015-08-25 (×4): 10 mg via INTRAVENOUS

## 2015-08-25 MED ORDER — ONDANSETRON HCL 4 MG/2ML IJ SOLN: 4.0000 mg | INTRAMUSCULAR | Status: DC | PRN

## 2015-08-25 MED ORDER — CEFAZOLIN SODIUM-DEXTROSE 2-4 GM/100ML-% IV SOLN
2.0000 g | INTRAVENOUS | Status: AC
  Administered 2015-08-25: 2 g via INTRAVENOUS
  Filled 2015-08-25: qty 100

## 2015-08-25 MED ORDER — HYDROMORPHONE HCL 1 MG/ML IJ SOLN
INTRAMUSCULAR | Status: AC
  Filled 2015-08-25: qty 1

## 2015-08-25 MED ORDER — LACTATED RINGERS IV SOLN
INTRAVENOUS | Status: DC | PRN
  Administered 2015-08-25 (×2): via INTRAVENOUS

## 2015-08-25 MED ORDER — LIDOCAINE HCL (CARDIAC) 20 MG/ML IV SOLN
INTRAVENOUS | Status: DC | PRN
  Administered 2015-08-25: 50 mg via INTRAVENOUS

## 2015-08-25 MED ORDER — SUCCINYLCHOLINE CHLORIDE 20 MG/ML IJ SOLN
INTRAMUSCULAR | Status: DC | PRN
  Administered 2015-08-25: 180 mg via INTRAVENOUS

## 2015-08-25 MED ORDER — ACETAMINOPHEN 500 MG PO TABS
1000.0000 mg | ORAL_TABLET | Freq: Four times a day (QID) | ORAL | Status: AC
  Administered 2015-08-25 – 2015-08-26 (×3): 1000 mg via ORAL
  Filled 2015-08-25 (×4): qty 2

## 2015-08-25 MED ORDER — PHENYLEPHRINE HCL 10 MG/ML IJ SOLN
INTRAVENOUS | Status: DC | PRN
  Administered 2015-08-25: 40 ug/min via INTRAVENOUS

## 2015-08-25 MED ORDER — HYDROMORPHONE HCL 1 MG/ML IJ SOLN
0.5000 mg | INTRAMUSCULAR | Status: DC | PRN
  Administered 2015-08-25 – 2015-08-26 (×2): 1 mg via INTRAVENOUS
  Filled 2015-08-25 (×2): qty 1

## 2015-08-25 MED ORDER — HYDROMORPHONE HCL 1 MG/ML IJ SOLN
0.2500 mg | INTRAMUSCULAR | Status: DC | PRN
  Administered 2015-08-25 (×2): 0.5 mg via INTRAVENOUS

## 2015-08-25 MED ORDER — MIDAZOLAM HCL 2 MG/2ML IJ SOLN
INTRAMUSCULAR | Status: AC
  Filled 2015-08-25: qty 2

## 2015-08-25 MED ORDER — OXYCODONE-ACETAMINOPHEN 5-325 MG PO TABS: 1.0000 | ORAL_TABLET | ORAL | 0 refills | Status: DC | PRN

## 2015-08-25 MED ORDER — DOCUSATE SODIUM 100 MG PO CAPS
100.0000 mg | ORAL_CAPSULE | Freq: Two times a day (BID) | ORAL | Status: DC
  Administered 2015-08-25 – 2015-08-27 (×4): 100 mg via ORAL
  Filled 2015-08-25 (×7): qty 1

## 2015-08-25 MED ORDER — ONDANSETRON HCL 4 MG/2ML IJ SOLN: 4.0000 mg | Freq: Four times a day (QID) | INTRAMUSCULAR | Status: DC | PRN

## 2015-08-25 MED ORDER — LACTATED RINGERS IR SOLN
Status: DC | PRN
  Administered 2015-08-25: 100 mL

## 2015-08-25 MED ORDER — PHENYLEPHRINE 40 MCG/ML (10ML) SYRINGE FOR IV PUSH (FOR BLOOD PRESSURE SUPPORT)
PREFILLED_SYRINGE | INTRAVENOUS | Status: AC
  Filled 2015-08-25: qty 10

## 2015-08-25 MED ORDER — INSULIN ASPART 100 UNIT/ML ~~LOC~~ SOLN: 5.0000 [IU] | Freq: Once | SUBCUTANEOUS | Status: DC

## 2015-08-25 MED ORDER — SUGAMMADEX SODIUM 200 MG/2ML IV SOLN
INTRAVENOUS | Status: AC
  Filled 2015-08-25: qty 2

## 2015-08-25 MED ORDER — OXYCODONE HCL 5 MG/5ML PO SOLN
5.0000 mg | Freq: Once | ORAL | Status: DC | PRN
Start: 2015-08-25 — End: 2015-08-25
  Filled 2015-08-25: qty 5

## 2015-08-25 MED ORDER — OXYCODONE HCL 5 MG PO TABS: 5.0000 mg | ORAL_TABLET | Freq: Once | ORAL | Status: DC | PRN

## 2015-08-25 MED ORDER — OXYCODONE HCL 5 MG PO TABS
5.0000 mg | ORAL_TABLET | ORAL | Status: DC | PRN
  Administered 2015-08-25 – 2015-08-26 (×3): 5 mg via ORAL
  Filled 2015-08-25 (×3): qty 1

## 2015-08-25 MED ORDER — FENTANYL CITRATE (PF) 100 MCG/2ML IJ SOLN
INTRAMUSCULAR | Status: AC
  Filled 2015-08-25: qty 2

## 2015-08-25 MED ORDER — MIDAZOLAM HCL 5 MG/5ML IJ SOLN
INTRAMUSCULAR | Status: DC | PRN
  Administered 2015-08-25: 2 mg via INTRAVENOUS

## 2015-08-25 MED ORDER — SUGAMMADEX SODIUM 500 MG/5ML IV SOLN
INTRAVENOUS | Status: AC
  Filled 2015-08-25: qty 5

## 2015-08-25 MED ORDER — SUGAMMADEX SODIUM 200 MG/2ML IV SOLN
INTRAVENOUS | Status: DC | PRN
  Administered 2015-08-25: 200 mg via INTRAVENOUS

## 2015-08-25 MED ORDER — ROCURONIUM BROMIDE 100 MG/10ML IV SOLN
INTRAVENOUS | Status: AC
  Filled 2015-08-25: qty 1

## 2015-08-25 MED ORDER — PHENYLEPHRINE HCL 10 MG/ML IJ SOLN
INTRAMUSCULAR | Status: DC | PRN
  Administered 2015-08-25: 40 ug via INTRAVENOUS
  Administered 2015-08-25: 80 ug via INTRAVENOUS
  Administered 2015-08-25: 40 ug via INTRAVENOUS
  Administered 2015-08-25 (×2): 80 ug via INTRAVENOUS

## 2015-08-25 MED ORDER — PHENYLEPHRINE HCL 10 MG/ML IJ SOLN
INTRAMUSCULAR | Status: AC
  Filled 2015-08-25: qty 1

## 2015-08-25 MED ORDER — ONDANSETRON HCL 4 MG/2ML IJ SOLN
INTRAMUSCULAR | Status: DC | PRN
  Administered 2015-08-25: 4 mg via INTRAVENOUS

## 2015-08-25 MED ORDER — LACTATED RINGERS IV SOLN: INTRAVENOUS | Status: DC

## 2015-08-25 MED ORDER — FENTANYL CITRATE (PF) 100 MCG/2ML IJ SOLN
INTRAMUSCULAR | Status: DC | PRN
  Administered 2015-08-25: 25 ug via INTRAVENOUS
  Administered 2015-08-25: 50 ug via INTRAVENOUS
  Administered 2015-08-25: 25 ug via INTRAVENOUS
  Administered 2015-08-25: 100 ug via INTRAVENOUS
  Administered 2015-08-25 (×2): 25 ug via INTRAVENOUS

## 2015-08-25 MED ORDER — CEFAZOLIN SODIUM-DEXTROSE 2-4 GM/100ML-% IV SOLN
INTRAVENOUS | Status: AC
  Filled 2015-08-25: qty 100

## 2015-08-25 MED ORDER — MANNITOL 25 % IV SOLN
INTRAVENOUS | Status: AC
  Filled 2015-08-25: qty 100

## 2015-08-25 MED ORDER — INSULIN ASPART 100 UNIT/ML ~~LOC~~ SOLN
0.0000 [IU] | SUBCUTANEOUS | Status: DC
  Administered 2015-08-25 (×2): 8 [IU] via SUBCUTANEOUS
  Administered 2015-08-26: 4 [IU] via SUBCUTANEOUS
  Administered 2015-08-26: 2 [IU] via SUBCUTANEOUS
  Administered 2015-08-26: 4 [IU] via SUBCUTANEOUS
  Administered 2015-08-26: 12 [IU] via SUBCUTANEOUS
  Administered 2015-08-26: 8 [IU] via SUBCUTANEOUS
  Administered 2015-08-27: 4 [IU] via SUBCUTANEOUS
  Administered 2015-08-27: 2 [IU] via SUBCUTANEOUS

## 2015-08-25 MED ORDER — INSULIN ASPART 100 UNIT/ML ~~LOC~~ SOLN
SUBCUTANEOUS | Status: AC
  Filled 2015-08-25: qty 1

## 2015-08-25 MED ORDER — NEOSTIGMINE METHYLSULFATE 10 MG/10ML IV SOLN
INTRAVENOUS | Status: AC
  Filled 2015-08-25: qty 1

## 2015-08-25 MED ORDER — PROPOFOL 10 MG/ML IV BOLUS
INTRAVENOUS | Status: DC | PRN
  Administered 2015-08-25: 180 mg via INTRAVENOUS

## 2015-08-25 MED ORDER — PROPOFOL 10 MG/ML IV BOLUS
INTRAVENOUS | Status: AC
  Filled 2015-08-25: qty 20

## 2015-08-25 MED ORDER — ONDANSETRON HCL 4 MG/2ML IJ SOLN
INTRAMUSCULAR | Status: AC
  Filled 2015-08-25: qty 2

## 2015-08-25 MED ORDER — BUPIVACAINE LIPOSOME 1.3 % IJ SUSP
20.0000 mL | Freq: Once | INTRAMUSCULAR | Status: AC
  Administered 2015-08-25: 20 mL
  Filled 2015-08-25: qty 20

## 2015-08-25 MED ORDER — FENTANYL CITRATE (PF) 250 MCG/5ML IJ SOLN
INTRAMUSCULAR | Status: AC
  Filled 2015-08-25: qty 5

## 2015-08-25 MED ORDER — LACTATED RINGERS IV SOLN
INTRAVENOUS | Status: DC
  Administered 2015-08-25 – 2015-08-26 (×2): via INTRAVENOUS

## 2015-08-25 SURGICAL SUPPLY — 62 items
APPLICATOR SURGIFLO ENDO (HEMOSTASIS) ×2 IMPLANT
CHLORAPREP W/TINT 26ML (MISCELLANEOUS) ×2 IMPLANT
CLIP LIGATING HEM O LOK PURPLE (MISCELLANEOUS) ×2 IMPLANT
CLIP LIGATING HEMO O LOK GREEN (MISCELLANEOUS) ×8 IMPLANT
CLIP SUT LAPRA TY ABSORB (SUTURE) ×4 IMPLANT
COVER TIP SHEARS 8 DVNC (MISCELLANEOUS) ×1 IMPLANT
COVER TIP SHEARS 8MM DA VINCI (MISCELLANEOUS) ×1
DECANTER SPIKE VIAL GLASS SM (MISCELLANEOUS) ×2 IMPLANT
DRAIN CHANNEL 15F RND FF 3/16 (WOUND CARE) ×2 IMPLANT
DRAPE ARM DVNC X/XI (DISPOSABLE) ×4 IMPLANT
DRAPE COLUMN DVNC XI (DISPOSABLE) ×1 IMPLANT
DRAPE DA VINCI XI ARM (DISPOSABLE) ×4
DRAPE DA VINCI XI COLUMN (DISPOSABLE) ×1
DRAPE INCISE IOBAN 66X45 STRL (DRAPES) ×2 IMPLANT
DRAPE SHEET LG 3/4 BI-LAMINATE (DRAPES) ×2 IMPLANT
DRSG TEGADERM 4X4.75 (GAUZE/BANDAGES/DRESSINGS) ×2 IMPLANT
ELECT PENCIL ROCKER SW 15FT (MISCELLANEOUS) ×2 IMPLANT
ELECT REM PT RETURN 9FT ADLT (ELECTROSURGICAL) ×2
ELECTRODE REM PT RTRN 9FT ADLT (ELECTROSURGICAL) ×1 IMPLANT
EVACUATOR SILICONE 100CC (DRAIN) ×2 IMPLANT
GAUZE SPONGE 2X2 8PLY STRL LF (GAUZE/BANDAGES/DRESSINGS) ×1 IMPLANT
GLOVE BIO SURGEON STRL SZ 6.5 (GLOVE) ×2 IMPLANT
GLOVE BIOGEL M 8.0 STRL (GLOVE) ×4 IMPLANT
GOWN STRL REUS W/TWL LRG LVL3 (GOWN DISPOSABLE) ×6 IMPLANT
HEMOSTAT SURGICEL 4X8 (HEMOSTASIS) ×2 IMPLANT
IRRIG SUCT STRYKERFLOW 2 WTIP (MISCELLANEOUS) ×2
IRRIGATION SUCT STRKRFLW 2 WTP (MISCELLANEOUS) ×1 IMPLANT
KIT BASIN OR (CUSTOM PROCEDURE TRAY) ×2 IMPLANT
LIQUID BAND (GAUZE/BANDAGES/DRESSINGS) ×4 IMPLANT
LOOP VESSEL MAXI BLUE (MISCELLANEOUS) IMPLANT
MARKER SKIN DUAL TIP RULER LAB (MISCELLANEOUS) ×2 IMPLANT
NEEDLE INSUFFLATION 14GA 120MM (NEEDLE) ×2 IMPLANT
NS IRRIG 1000ML POUR BTL (IV SOLUTION) IMPLANT
POSITIONER SURGICAL ARM (MISCELLANEOUS) ×2 IMPLANT
POUCH SPECIMEN RETRIEVAL 10MM (ENDOMECHANICALS) ×2 IMPLANT
RELOAD STAPLER WHITE 60MM (STAPLE) IMPLANT
SEAL CANN UNIV 5-8 DVNC XI (MISCELLANEOUS) ×4 IMPLANT
SEAL XI 5MM-8MM UNIVERSAL (MISCELLANEOUS) ×4
SOLUTION ELECTROLUBE (MISCELLANEOUS) ×2 IMPLANT
SPONGE GAUZE 2X2 STER 10/PKG (GAUZE/BANDAGES/DRESSINGS) ×1
SPONGE LAP 4X18 X RAY DECT (DISPOSABLE) ×2 IMPLANT
STAPLE ECHEON FLEX 60 POW ENDO (STAPLE) IMPLANT
STAPLER RELOAD WHITE 60MM (STAPLE)
SURGIFLO W/THROMBIN 8M KIT (HEMOSTASIS) ×2 IMPLANT
SUT ETHILON 3 0 PS 1 (SUTURE) ×2 IMPLANT
SUT MNCRL AB 4-0 PS2 18 (SUTURE) ×4 IMPLANT
SUT VIC AB 0 CT1 27 (SUTURE) ×5
SUT VIC AB 0 CT1 27XBRD ANTBC (SUTURE) ×5 IMPLANT
SUT VIC AB 2-0 SH 27 (SUTURE)
SUT VIC AB 2-0 SH 27X BRD (SUTURE) IMPLANT
SUT VLOC BARB 180 ABS3/0GR12 (SUTURE) ×2
SUTURE VLOC BRB 180 ABS3/0GR12 (SUTURE) ×1 IMPLANT
TAPE STRIPS DRAPE STRL (GAUZE/BANDAGES/DRESSINGS) IMPLANT
TOWEL OR 17X26 10 PK STRL BLUE (TOWEL DISPOSABLE) IMPLANT
TOWEL OR NON WOVEN STRL DISP B (DISPOSABLE) ×2 IMPLANT
TRAY FOLEY W/METER SILVER 14FR (SET/KITS/TRAYS/PACK) ×2 IMPLANT
TRAY LAPAROSCOPIC (CUSTOM PROCEDURE TRAY) ×2 IMPLANT
TROCAR BLADELESS OPT 5 100 (ENDOMECHANICALS) ×2 IMPLANT
TROCAR UNIVERSAL OPT 12M 100M (ENDOMECHANICALS) ×2 IMPLANT
TROCAR XCEL 12X100 BLDLESS (ENDOMECHANICALS) ×2 IMPLANT
WATER STERILE IRR 1000ML POUR (IV SOLUTION) ×2 IMPLANT
WATER STERILE IRR 1500ML POUR (IV SOLUTION) IMPLANT

## 2015-08-25 NOTE — Anesthesia Postprocedure Evaluation (Signed)
Anesthesia Post Note  Patient: Sandra Terrell  Procedure(s) Performed: Procedure(s) (LRB): XI ROBOTIC ASSITED RIGHT PARTIAL NEPHRECTOMY (Right)  Patient location during evaluation: PACU Anesthesia Type: General Level of consciousness: oriented, patient cooperative and sedated Pain management: pain level controlled Vital Signs Assessment: post-procedure vital signs reviewed and stable Respiratory status: spontaneous breathing, nonlabored ventilation, respiratory function stable and patient connected to nasal cannula oxygen Cardiovascular status: blood pressure returned to baseline and stable Postop Assessment: no signs of nausea or vomiting Anesthetic complications: no    Last Vitals:  Vitals:   08/25/15 1700 08/25/15 1715  BP: (!) 174/47 (!) 152/52  Pulse:  88  Resp:  14  Temp: 36.6 C     Last Pain:  Vitals:   08/25/15 1645  TempSrc:   PainSc: 4                  Coden Franchi,E. Kidada Ging

## 2015-08-25 NOTE — Addendum Note (Signed)
Addendum  created 08/25/15 1753 by West Pugh, CRNA   Anesthesia Intra Flowsheets edited

## 2015-08-25 NOTE — Op Note (Signed)
Preoperative diagnosis: Right renal mass  Postop diagnosis: Same  Procedure: 1.  Right robot assisted laparoscopic radical partial nephrectomy  Attending: Nicolette Bang  Assistant: Virginia Crews MD  Anesthesia: General  Estimated blood loss: 100 cc  Drains: 16 French Foley catheter, JP drain  Specimens: Right renal mass with overlying renal fat  Antibiotics: ancef  Findings: 1 artery and 2 vein. 2.5cm right posterior mid pole renal mass Warm Ischemia time: 0 minutes  Indications: Patient is a 50 year old with a history of 2.5 cm right renal mass.  The mass was amenable to partial nephrectomy.  After discussing treatment options patient decided to proceed with right robot assisted laparoscopic partial nephrectomy.  Procedure in detail: Prior to procedure consent was obtained. Patient was brought to the operating room and briefing was done sure correct patient, correct procedure, correct site.  General anesthesia was in administered patient was placed in the left lateral decubitus position.  a 23 French catheter was placed. their abdomen and flank was then prepped and draped usual sterile fashion.  A Veress needle was used to obtain pneumoperitoneum.  Once pneumoperitoneum was reestablished to 15 mmHg we then placed a 8 mm camera port lateral to the umbilicus at the latera; edge of rectus.  We then proceeded to place 3 more robotic ports. We then placed an assistant port. We then docked the robot.  We then started this dissection along the white line of [place].  We then reflected the colon medially.  We then kocherized the duodenum. We then identified the psoas muscle.  Once this was done we traced it down to the iliac vessels and identified the ureter.  Once we identified the gonadal vein and ureter were then traced this to the renal hilum.  The renal vein and renal artery were skeletonized.  We did we identified two renal veins and one renal artery. We then turned our attention to locating  the renal mass. We proceeded to remove the overlying renal fat until we identified the renal mass. Once this was done with the circumscribed the mass with electrocautery. Using sharp dissection the mass was then removed. Using a 2-0 V lock suture we oversewed the tumor bed. We then used 0 Vicryl with hem-o-locks in an interrupted fashion to reapproximate the defect int he kidney. We noted no residual bleeding. We then placed the specimen in an Endo Catch bag.  Once the specimen was in the Endo Catch bag we then inspected the left kidney and noted no residual bleeding. We then placed a JP drain in the lower quadrant robot port. THis was secured with a 0 nylon.  We then removed our instruments, undocked the robot, and released the pneumoperitoneum. Once the specimen was removed we then closed the camera and assistant ports with 0 Vicryl in interrupted fashion.  The skin was then subcuticularly closed with 4-0 Monocryl.  We then placed Dermabond over all the incisions.  This included the procedure which resulted by the patient.  Complications: None  Condition: Stable, extubated, transferred to PACU.  Plan: Patient is to be admitted for inpatient stay. The foley catheter will be removed in the morning. They will be started on a clear liquid diet POD#1

## 2015-08-25 NOTE — Anesthesia Procedure Notes (Signed)
Procedure Name: Intubation Date/Time: 08/25/2015 12:46 PM Performed by: Anne Fu Pre-anesthesia Checklist: Patient identified, Emergency Drugs available, Suction available, Patient being monitored and Timeout performed Patient Re-evaluated:Patient Re-evaluated prior to inductionOxygen Delivery Method: Circle system utilized Preoxygenation: Pre-oxygenation with 100% oxygen Intubation Type: IV induction Ventilation: Mask ventilation without difficulty Laryngoscope Size: Mac and 4 Grade View: Grade I Tube type: Oral Tube size: 7.5 mm Number of attempts: 1 Airway Equipment and Method: Stylet Placement Confirmation: ETT inserted through vocal cords under direct vision,  positive ETCO2,  CO2 detector and breath sounds checked- equal and bilateral Secured at: 19 cm Tube secured with: Tape Dental Injury: Teeth and Oropharynx as per pre-operative assessment

## 2015-08-25 NOTE — Transfer of Care (Signed)
Immediate Anesthesia Transfer of Care Note  Patient: Sandra Terrell  Procedure(s) Performed: Procedure(s): XI ROBOTIC ASSITED RIGHT PARTIAL NEPHRECTOMY (Right)  Patient Location: PACU  Anesthesia Type:General  Level of Consciousness:  sedated, patient cooperative and responds to stimulation  Airway & Oxygen Therapy:Patient Spontanous Breathing and Patient connected to face mask oxgen  Post-op Assessment:  Report given to PACU RN and Post -op Vital signs reviewed and stable  Post vital signs:  Reviewed and stable  Last Vitals:  Vitals:   08/25/15 1052 08/25/15 1617  BP: (!) 150/62 139/90  Pulse: 72 79  Resp: 18 (!) 22  Temp: 36.8 C [id number] C    Complications: No apparent anesthesia complications

## 2015-08-25 NOTE — Discharge Summary (Signed)
Date of admission: 08/25/2015  Date of discharge: 08/27/2015  Admission diagnosis: right renal mass  Discharge diagnosis: right renal mass  Secondary diagnoses: none  History and Physical: For full details, please see admission history and physical. Briefly, Sandra Terrell is a 50 y.o. year old patient with right renal mass.   Hospital Course: 50 yo female who is s/p robotic right partial nephrectomy with Dr. Alyson Ingles. She did well post-operatively. Her diet was slowly advanced and at the time of discharge he was tolerating a regular diet, ambulating at her baseline, was voiding spontaneously after foley catheter removal, and pain was well controlled with oral narcotics. Her JP fluid was sent for creatinine level and this returned consistent with serum. She did have a fever to 101.4 on POD#1 which was likely due to atelectasis and improved without intervention. UA was negative for infection. Her drain was then removed on POD#2. She was discharged to home on POD#2.  Laboratory values:   Recent Labs  08/25/15 1632 08/26/15 0528 08/27/15 0507  HGB 12.6 11.4* 11.7*  HCT 39.4 36.5 38.1    Recent Labs  08/26/15 0528 08/27/15 0507  CREATININE 0.59 0.63    Disposition: Home  Discharge instruction:  Discharge Instructions    Discharge instructions    Complete by:  As directed   Activity:  You are encouraged to ambulate frequently (about every hour during waking hours) to help prevent blood clots from forming in your legs or lungs.  However, you should not engage in any heavy lifting (> 10-15 lbs), strenuous activity, or straining. Diet: You should advance your diet as instructed by your physician.  It will be normal to have some bloating, nausea, and abdominal discomfort intermittently. Prescriptions:  You will be provided a prescription for pain medication to take as needed.  If your pain is not severe enough to require the prescription pain medication, you may take extra strength Tylenol  instead which will have less side effects.  You should also take a prescribed stool softener to avoid straining with bowel movements as the prescription pain medication may constipate you. Incisions: You may remove your dressing bandages 48 hours after surgery if not removed in the hospital.  You will either have some small staples or special tissue glue at each of the incision sites. Once the bandages are removed (if present), the incisions may stay open to air.  You may start showering (but not soaking or bathing in water) the 2nd day after surgery and the incisions simply need to be patted dry after the shower.  No additional care is needed. What to call us about: You should call the office [phone]) if you develop fever > 101 or develop persistent vomiting.      Discharge medications:    Medication List    TAKE these medications   aspirin 81 MG tablet Take 81 mg by mouth daily.   dapagliflozin propanediol 10 MG Tabs tablet Commonly known as:  FARXIGA Take 10 mg by mouth daily.   Fish Oil 1000 MG Caps Take by mouth 3 (three) times daily.   metFORMIN 1000 MG tablet Commonly known as:  GLUCOPHAGE Take 1 tablet (1,000 mg total) by mouth 2 (two) times daily with a meal.   naproxen sodium 220 MG tablet Commonly known as:  ANAPROX Take 220-440 mg by mouth daily as needed (pain).   oxyCODONE-acetaminophen 5-325 MG tablet Commonly known as:  ROXICET Take 1 tablet by mouth every 4 (four) hours as needed for severe pain.  Followup:

## 2015-08-25 NOTE — Anesthesia Preprocedure Evaluation (Signed)
Anesthesia Evaluation  Patient identified by MRN, date of birth, ID band Patient awake    Reviewed: Allergy & Precautions, H&P , NPO status , Patient's Chart, lab work & pertinent test results  Airway Mallampati: II   Neck ROM: full    Dental   Pulmonary neg pulmonary ROS,    breath sounds clear to auscultation       Cardiovascular hypertension,  Rhythm:regular Rate:Normal     Neuro/Psych    GI/Hepatic   Endo/Other  diabetes, Type 2  Renal/GU      Musculoskeletal  (+) Arthritis ,   Abdominal   Peds  Hematology   Anesthesia Other Findings   Reproductive/Obstetrics                             Anesthesia Physical Anesthesia Plan  ASA: II  Anesthesia Plan: General   Post-op Pain Management:    Induction: Intravenous  Airway Management Planned: Oral ETT  Additional Equipment:   Intra-op Plan:   Post-operative Plan: Extubation in OR  Informed Consent: I have reviewed the patients History and Physical, chart, labs and discussed the procedure including the risks, benefits and alternatives for the proposed anesthesia with the patient or authorized representative who has indicated his/her understanding and acceptance.     Plan Discussed with: CRNA, Anesthesiologist and Surgeon  Anesthesia Plan Comments:         Anesthesia Quick Evaluation

## 2015-08-25 NOTE — H&P (Signed)
Urology Admission H&P  Chief Complaint: right renal mass  History of Present Illness: Ms Sandra Terrell is a 50yo with a hx of DMII and HTN who was found to have a 2cm right exophytic renal mass on abdominal CT imaging. She denies any flank pain. She denies any LUTS  Past Medical History:  Diagnosis Date  . Arthritis   . Diabetes mellitus without complication ([place])   . Hypertension    Past Surgical History:  Procedure Laterality Date  . ABDOMINAL HYSTERECTOMY    . CARPAL TUNNEL RELEASE Left     Home Medications:  Prescriptions Prior to Admission  Medication Sig Dispense Refill Last Dose  . aspirin 81 MG tablet Take 81 mg by mouth daily.   08/11/2015  . dapagliflozin propanediol (FARXIGA) 10 MG TABS tablet Take 10 mg by mouth daily. 30 tablet 3 08/24/2015 at Unknown time  . metFORMIN (GLUCOPHAGE) 1000 MG tablet Take 1 tablet (1,000 mg total) by mouth 2 (two) times daily with a meal. 60 tablet 2 08/24/2015 at Unknown time  . naproxen sodium (ANAPROX) 220 MG tablet Take 220-440 mg by mouth daily as needed (pain).   08/18/2015  . Omega-3 Fatty Acids (FISH OIL) 1000 MG CAPS Take by mouth 3 (three) times daily.   08/11/2015   Allergies:  Allergies  Allergen Reactions  . Lisinopril Swelling    angioedema    Family History  Problem Relation Age of Onset  . Diabetes Mother   . Hypertension Mother   . Cancer Father   . Diabetes Sister   . Hypertension Sister    Social History:  reports that she has never smoked. She has never used smokeless tobacco. She reports that she does not drink alcohol or use drugs.  Review of Systems  All other systems reviewed and are negative.   Physical Exam:  Vital signs in last 24 hours: Temp:  [98.3 F (36.8 C)] 98.3 F (36.8 C) (07/28 1052) Pulse Rate:  [72] 72 (07/28 1052) Resp:  [18] 18 (07/28 1052) BP: (150)/(62) 150/62 (07/28 1052) SpO2:  [100 %] 100 % (07/28 1052) Weight:  [108 kg (238 lb)] 108 kg (238 lb) (07/28 1102) Physical Exam   Constitutional: She is oriented to person, place, and time. She appears well-developed and well-nourished.  HENT:  Head: Normocephalic and atraumatic.  Eyes: EOM are normal. Pupils are equal, round, and reactive to light.  Neck: Normal range of motion. No thyromegaly present.  Cardiovascular: Normal rate and regular rhythm.   Respiratory: Effort normal and breath sounds normal.  GI: Soft. She exhibits no distension.  Musculoskeletal: Normal range of motion.  Neurological: She is alert and oriented to person, place, and time.  Skin: Skin is warm and dry.  Psychiatric: She has a normal mood and affect. Her behavior is normal. Judgment and thought content normal.    Laboratory Data:  Results for orders placed or performed during the hospital encounter of 08/25/15 (from the past 24 hour(s))  Glucose, capillary     Status: Abnormal   Collection Time: 08/25/15 10:50 AM  Result Value Ref Range   Glucose-Capillary 218 (H) 65 - 99 mg/dL   No results found for this or any previous visit (from the past 240 hour(s)). Creatinine:  Recent Labs  08/22/15 0935  CREATININE 0.73   Baseline Creatinine: 0.73  Impression/Assessment:  50yo with right renal mass  Plan:  The risks/benefits/alternatives to Right robotic partial nephrectomy was explained to the patient and she understands and wishes to proceed with surgery  Sandra Terrell 08/25/2015, 12:11 PM

## 2015-08-26 DIAGNOSIS — D1771 Benign lipomatous neoplasm of kidney: Secondary | ICD-10-CM | POA: Diagnosis not present

## 2015-08-26 LAB — BASIC METABOLIC PANEL
Anion gap: 5 (ref 5–15)
BUN: 7 mg/dL (ref 6–20)
CO2: 27 mmol/L (ref 22–32)
Calcium: 8.5 mg/dL — ABNORMAL LOW (ref 8.9–10.3)
Chloride: 106 mmol/L (ref 101–111)
Creatinine, Ser: 0.59 mg/dL (ref 0.44–1.00)
GFR calc Af Amer: >60 mL/min (ref >60)
GFR calc non Af Amer: >60 mL/min (ref >60)
Glucose, Bld: 116 mg/dL — ABNORMAL HIGH (ref 65–99)
Potassium: 4 mmol/L (ref 3.5–5.1)
Sodium: 138 mmol/L (ref 135–145)

## 2015-08-26 LAB — HEMOGLOBIN AND HEMATOCRIT, BLOOD
HCT: 36.5 % (ref 36.0–46.0)
Hemoglobin: 11.4 g/dL — ABNORMAL LOW (ref 12.0–15.0)

## 2015-08-26 LAB — CREATININE, FLUID (PLEURAL, PERITONEAL, JP DRAINAGE): Creat, Fluid: 0.7 mg/dL

## 2015-08-26 LAB — GLUCOSE, CAPILLARY
Glucose-Capillary: 149 mg/dL — ABNORMAL HIGH (ref 65–99)
Glucose-Capillary: 164 mg/dL — ABNORMAL HIGH (ref 65–99)
Glucose-Capillary: 164 mg/dL — ABNORMAL HIGH (ref 65–99)
Glucose-Capillary: 201 mg/dL — ABNORMAL HIGH (ref 65–99)
Glucose-Capillary: 275 mg/dL — ABNORMAL HIGH (ref 65–99)
Glucose-Capillary: 94 mg/dL (ref 65–99)

## 2015-08-26 MED ORDER — BISACODYL 10 MG RE SUPP
10.0000 mg | Freq: Once | RECTAL | Status: AC
  Administered 2015-08-26: 10 mg via RECTAL
  Filled 2015-08-26: qty 1

## 2015-08-26 MED ORDER — ACETAMINOPHEN 325 MG PO TABS
650.0000 mg | ORAL_TABLET | ORAL | Status: DC | PRN
  Administered 2015-08-26 – 2015-08-27 (×2): 650 mg via ORAL
  Filled 2015-08-26 (×2): qty 2

## 2015-08-26 MED ORDER — ZOLPIDEM TARTRATE 5 MG PO TABS: 5.0000 mg | ORAL_TABLET | Freq: Every evening | ORAL | Status: DC | PRN

## 2015-08-26 MED ORDER — OXYCODONE HCL 5 MG PO TABS
10.0000 mg | ORAL_TABLET | ORAL | Status: DC | PRN
  Administered 2015-08-26 – 2015-08-27 (×3): 10 mg via ORAL
  Filled 2015-08-26 (×3): qty 2

## 2015-08-26 MED ORDER — OXYCODONE HCL 5 MG PO TABS
5.0000 mg | ORAL_TABLET | ORAL | Status: DC | PRN
  Filled 2015-08-26: qty 1

## 2015-08-26 MED ORDER — HYDRALAZINE HCL 20 MG/ML IJ SOLN
10.0000 mg | INTRAMUSCULAR | Status: DC | PRN
  Administered 2015-08-26: 10 mg via INTRAVENOUS
  Filled 2015-08-26: qty 1

## 2015-08-26 MED ORDER — LABETALOL HCL 5 MG/ML IV SOLN
10.0000 mg | Freq: Once | INTRAVENOUS | Status: DC
  Filled 2015-08-26: qty 4

## 2015-08-26 NOTE — Progress Notes (Addendum)
Patient ID: Sandra Terrell, female   DOB: 12/06/1965, 50 y.o.   MRN: [id number] 1 Day Post-Op Subjective: Doing well. Has ambulated twice today.  No nausea or vomiting. JP creatinine returned consistent with serum. She would like to improve pain control and go home tomorrow.  Objective: Vital signs in last 24 hours: Temp:  [97.7 F (36.5 C)-98.6 F (37 C)] 98.6 F (37 C) (07/29 1432) Pulse Rate:  [68-93] 93 (07/29 1432) Resp:  [14-24] 20 (07/29 1432) BP: (118-174)/(47-90) 142/47 (07/29 1432) SpO2:  [97 %-100 %] 100 % (07/29 1432)  Intake/Output from previous day: 07/28 0701 - 07/29 0700 In: 2903.3 [P.O.:480; I.V.:2423.3] Out: 1720 [Urine:1510; Drains:60; Blood:150] Intake/Output this shift: Total I/O In: 925 [P.O.:120; I.V.:805] Out: 315 [Urine:275; Drains:40]  Physical Exam:  General: Alert and oriented CV: RRR Lungs: Clear Abdomen: Soft, ND  Incisions: C/D/I Ext: NT, No erythema  Lab Results:  Recent Labs  08/25/15 1632 08/26/15 0528  HGB 12.6 11.4*  HCT 39.4 36.5   BMET  Recent Labs  08/25/15 1632 08/26/15 0528  NA 139 138  K 3.9 4.0  CL 106 106  CO2 24 27  GLUCOSE 283* 116*  BUN 10 7  CREATININE 0.85 0.59  CALCIUM 8.8* 8.5*     Studies/Results: No results found.  Assessment/Plan: POD # 1 s/p right RAL partial nephrectomy - Ambulate, IS - Increase PO pain medication to 10mg  oxycodone every four hours PRN - d/c tomorrow   LOS: 1 day   Lolita Rieger 08/26/2015, 3:11 PM

## 2015-08-26 NOTE — Progress Notes (Signed)
Patient ID: Sandra Terrell, female   DOB: 08-Feb-1965, 50 y.o.   MRN: [id number] 1 Day Post-Op Subjective: Doing well.  No complaints.  No nausea or vomiting.  No flatus. Pain controlled.  Objective: Vital signs in last 24 hours: Temp:  [97.7 F (36.5 C)-98.4 F (36.9 C)] 97.7 F (36.5 C) (07/29 0625) Pulse Rate:  [68-90] 76 (07/29 0625) Resp:  [14-24] 16 (07/29 0625) BP: (118-174)/(47-90) 118/48 (07/29 0625) SpO2:  [100 %] 100 % (07/29 0625) Weight:  [108 kg (238 lb)] 108 kg (238 lb) (07/28 1102)  Intake/Output from previous day: 07/28 0701 - 07/29 0700 In: 2903.3 [P.O.:480; I.V.:2423.3] Out: 1720 [Urine:1510; Drains:60; Blood:150] Intake/Output this shift: Total I/O In: 805 [I.V.:805] Out: -   Physical Exam:  General: Alert and oriented CV: RRR Lungs: Clear Abdomen: Soft, ND, Positive BS Incisions: C/D/I Ext: NT, No erythema  Lab Results:  Recent Labs  08/25/15 1632 08/26/15 0528  HGB 12.6 11.4*  HCT 39.4 36.5   BMET  Recent Labs  08/25/15 1632 08/26/15 0528  NA 139 138  K 3.9 4.0  CL 106 106  CO2 24 27  GLUCOSE 283* 116*  BUN 10 7  CREATININE 0.85 0.59  CALCIUM 8.8* 8.5*     Studies/Results: No results found.  Assessment/Plan: POD # 1 s/p right RAL partial nephrectomy - Ambulate, IS - Advance diet - Po pain medication - Drain Cr - SL IVF - Re-evaluate later today for possible d/c   LOS: 1 day   Jewett Mcgann,LES 08/26/2015, 8:49 AM

## 2015-08-27 DIAGNOSIS — D1771 Benign lipomatous neoplasm of kidney: Secondary | ICD-10-CM | POA: Diagnosis not present

## 2015-08-27 LAB — GLUCOSE, CAPILLARY
Glucose-Capillary: 104 mg/dL — ABNORMAL HIGH (ref 65–99)
Glucose-Capillary: 170 mg/dL — ABNORMAL HIGH (ref 65–99)
Glucose-Capillary: 149 mg/dL — ABNORMAL HIGH (ref 65–99)

## 2015-08-27 LAB — BASIC METABOLIC PANEL
Anion gap: 9 (ref 5–15)
BUN: 8 mg/dL (ref 6–20)
CO2: 23 mmol/L (ref 22–32)
Calcium: 8.5 mg/dL — ABNORMAL LOW (ref 8.9–10.3)
Chloride: 104 mmol/L (ref 101–111)
Creatinine, Ser: 0.63 mg/dL (ref 0.44–1.00)
GFR calc Af Amer: >60 mL/min (ref >60)
GFR calc non Af Amer: >60 mL/min (ref >60)
Glucose, Bld: 175 mg/dL — ABNORMAL HIGH (ref 65–99)
Potassium: 3.7 mmol/L (ref 3.5–5.1)
Sodium: 136 mmol/L (ref 135–145)

## 2015-08-27 LAB — URINALYSIS, ROUTINE W REFLEX MICROSCOPIC
Bilirubin Urine: NEGATIVE
Glucose, UA: 100 mg/dL — AB
Hgb urine dipstick: NEGATIVE
Ketones, ur: 15 mg/dL — AB
Leukocytes, UA: NEGATIVE
Nitrite: NEGATIVE
Protein, ur: NEGATIVE mg/dL
Specific Gravity, Urine: 1.017 (ref 1.005–1.030)
pH: 5.5 (ref 5.0–8.0)

## 2015-08-27 LAB — HEMOGLOBIN AND HEMATOCRIT, BLOOD
HCT: 38.1 % (ref 36.0–46.0)
Hemoglobin: 11.7 g/dL — ABNORMAL LOW (ref 12.0–15.0)

## 2015-08-27 NOTE — Progress Notes (Signed)
Patient JP drain removed and dsg applied (CDI).  Patient tolerated well, no issues.  Patient given discharge instructions and Rx. Verbalized understanding.  Will continue with discharging patient.

## 2016-01-09 ENCOUNTER — Encounter (HOSPITAL_COMMUNITY): Payer: Self-pay | Admitting: Emergency Medicine

## 2016-01-09 ENCOUNTER — Emergency Department (HOSPITAL_COMMUNITY)
Admission: EM | Admit: 2016-01-09 | Discharge: 2016-01-09 | Disposition: A | Discharge status: Home / Self Care | Payer: BLUE CROSS/BLUE SHIELD | Attending: Emergency Medicine | Admitting: Emergency Medicine

## 2016-01-09 DIAGNOSIS — Z7984 Long term (current) use of oral hypoglycemic drugs: Secondary | ICD-10-CM | POA: Insufficient documentation

## 2016-01-09 DIAGNOSIS — Z79899 Other long term (current) drug therapy: Secondary | ICD-10-CM | POA: Insufficient documentation

## 2016-01-09 DIAGNOSIS — L299 Pruritus, unspecified: Secondary | ICD-10-CM | POA: Diagnosis present

## 2016-01-09 DIAGNOSIS — Z7982 Long term (current) use of aspirin: Secondary | ICD-10-CM | POA: Insufficient documentation

## 2016-01-09 DIAGNOSIS — Y999 Unspecified external cause status: Secondary | ICD-10-CM | POA: Diagnosis not present

## 2016-01-09 DIAGNOSIS — S20369A Insect bite (nonvenomous) of unspecified front wall of thorax, initial encounter: Secondary | ICD-10-CM | POA: Diagnosis not present

## 2016-01-09 DIAGNOSIS — Y9389 Activity, other specified: Secondary | ICD-10-CM | POA: Diagnosis not present

## 2016-01-09 DIAGNOSIS — Y929 Unspecified place or not applicable: Secondary | ICD-10-CM | POA: Insufficient documentation

## 2016-01-09 DIAGNOSIS — I10 Essential (primary) hypertension: Secondary | ICD-10-CM | POA: Insufficient documentation

## 2016-01-09 DIAGNOSIS — R42 Dizziness and giddiness: Secondary | ICD-10-CM | POA: Diagnosis not present

## 2016-01-09 DIAGNOSIS — E119 Type 2 diabetes mellitus without complications: Secondary | ICD-10-CM | POA: Diagnosis not present

## 2016-01-09 DIAGNOSIS — W57XXXA Bitten or stung by nonvenomous insect and other nonvenomous arthropods, initial encounter: Secondary | ICD-10-CM | POA: Diagnosis not present

## 2016-01-09 DIAGNOSIS — S30861A Insect bite (nonvenomous) of abdominal wall, initial encounter: Secondary | ICD-10-CM | POA: Insufficient documentation

## 2016-01-09 LAB — CBG MONITORING, ED: Glucose-Capillary: 132 mg/dL — ABNORMAL HIGH (ref 65–99)

## 2016-01-09 MED ORDER — DIPHENHYDRAMINE HCL 25 MG PO CAPS: 25.0000 mg | ORAL_CAPSULE | Freq: Four times a day (QID) | ORAL | 0 refills | Status: DC | PRN

## 2016-01-09 MED ORDER — DIPHENHYDRAMINE HCL 25 MG PO CAPS
50.0000 mg | ORAL_CAPSULE | Freq: Once | ORAL | Status: AC
  Administered 2016-01-09: 50 mg via ORAL
  Filled 2016-01-09: qty 2

## 2016-01-09 NOTE — ED Provider Notes (Signed)
[facility] DEPT Provider Note   CSN: [id number] Arrival date & time: 01/09/16  2136     History   Chief Complaint Chief Complaint  Patient presents with  . Insect Bite    HPI Sandra Terrell is a 50 y.o. female presenting with persistent redness, itching and swelling from a presumptive insect bite to her abdomen which occurred this afternoon when she was outdoors taking out the trash. She felt a sudden pinching sensation at the site and has continued to have itching and increasing redness. She endorses scratching the site a lot and denies pain at the site.  She was at work when she started to feel slightly lightheaded so presented here.  She denies nausea, vomiting, fever, abdominal pain or cramping. She also denies cp, palpitations and headache.  She has had no medicines for this problem prior to arrival.   The history is provided by the patient.    Past Medical History:  Diagnosis Date  . Arthritis   . Diabetes mellitus without complication ([place])   . Hypertension     Patient Active Problem List   Diagnosis Date Noted  . Renal mass, right 08/25/2015  . Hyperlipidemia 04/26/2015  . Morbid obesity due to excess calories ([place]) 04/26/2015  . Essential hypertension, benign 03/23/2015  . Uncontrolled type 2 diabetes mellitus without complication, with long-term current use of insulin ([place]) 03/23/2015    Past Surgical History:  Procedure Laterality Date  . ABDOMINAL HYSTERECTOMY    . CARPAL TUNNEL RELEASE Left   . ROBOTIC ASSITED PARTIAL NEPHRECTOMY Right 08/25/2015   Procedure: XI ROBOTIC ASSITED RIGHT PARTIAL NEPHRECTOMY;  Surgeon: Cleon Gustin, MD;  Location: [facility];  Service: Urology;  Laterality: Right;    OB History    Gravida Para Term Preterm AB Living             0   SAB TAB Ectopic Multiple Live Births                   Home Medications    Prior to Admission medications   Medication Sig Start Date End Date Taking? Authorizing Provider  aspirin 81  MG tablet Take 81 mg by mouth daily.    Historical Provider, MD  dapagliflozin propanediol (FARXIGA) 10 MG TABS tablet Take 10 mg by mouth daily. 04/26/15   Cassandria Anger, MD  diphenhydrAMINE (BENADRYL) 25 mg capsule Take 1 capsule (25 mg total) by mouth every 6 (six) hours as needed for itching. 01/09/16   Evalee Jefferson, PA-C  metFORMIN (GLUCOPHAGE) 1000 MG tablet Take 1 tablet (1,000 mg total) by mouth 2 (two) times daily with a meal. 03/23/15   Cassandria Anger, MD  naproxen sodium (ANAPROX) 220 MG tablet Take 220-440 mg by mouth daily as needed (pain).    Historical Provider, MD  Omega-3 Fatty Acids (FISH OIL) 1000 MG CAPS Take by mouth 3 (three) times daily.    Historical Provider, MD  oxyCODONE-acetaminophen (ROXICET) 5-325 MG tablet Take 1 tablet by mouth every 4 (four) hours as needed for severe pain. 08/25/15   Cleon Gustin, MD    Family History Family History  Problem Relation Age of Onset  . Diabetes Mother   . Hypertension Mother   . Cancer Father   . Diabetes Sister   . Hypertension Sister     Social History Social History  Substance Use Topics  . Smoking status: Never Smoker  . Smokeless tobacco: Never Used  . Alcohol use No  Allergies   Lisinopril   Review of Systems Review of Systems  Constitutional: Negative for chills and fever.  Respiratory: Negative for shortness of breath and wheezing.   Gastrointestinal: Negative for abdominal pain.  Skin: Positive for color change and rash.  Neurological: Positive for light-headedness. Negative for numbness.     Physical Exam Updated Vital Signs BP 155/71   Pulse 93   Temp 98 F (36.7 C) (Oral)   Resp 20   Ht 5\' 8"  (1.727 m)   Wt 108 kg   SpO2 100%   BMI 36.19 kg/m   Physical Exam  Constitutional: She appears well-developed and well-nourished. No distress.  HENT:  Head: Normocephalic.  Neck: Neck supple.  Cardiovascular: Normal rate.   Pulmonary/Chest: Effort normal. She has no wheezes.   Musculoskeletal: Normal range of motion. She exhibits no edema.  Skin: Skin is warm. There is erythema.  6 cm area of erythema surrounding a slightly raised indurated lesion.  No foreign body, no red streaking, located on upper abdominal/lower chest wall.     ED Treatments / Results  Labs (all labs ordered are listed, but only abnormal results are displayed) Labs Reviewed  CBG MONITORING, ED - Abnormal; Notable for the following:       Result Value   Glucose-Capillary 132 (*)    All other components within normal limits    EKG  EKG Interpretation None       Radiology No results found.  Procedures Procedures (including critical care time)  Medications Ordered in ED Medications  diphenhydrAMINE (BENADRYL) capsule 50 mg (50 mg Oral Given 01/09/16 2240)     Initial Impression / Assessment and Plan / ED Course  I have reviewed the triage vital signs and the nursing notes.  Pertinent labs & imaging results that were available during my care of the patient were reviewed by me and considered in my medical decision making (see chart for details).  Clinical Course     Pt with insect bite with localized reaction.  No systemic sx suggesting brown recluse or black widow.  No orthostasis.  Benadryl, ice packs to bite site,  cortaid topical bid.  F/u with pcp for any persistent or worsened sx.   Final Clinical Impressions(s) / ED Diagnoses   Final diagnoses:  Insect bite, initial encounter    New Prescriptions New Prescriptions   DIPHENHYDRAMINE (BENADRYL) 25 MG CAPSULE    Take 1 capsule (25 mg total) by mouth every 6 (six) hours as needed for itching.     Evalee Jefferson, PA-C 01/09/16 Wadley Liu, MD 01/10/16 [phone]

## 2016-01-09 NOTE — Discharge Instructions (Signed)
Continue using benadryl for itch along with ice packs to help minimize swelling and itching.  You may also apply a cortisone cream such as Cortaid twice daily.

## 2016-01-09 NOTE — ED Triage Notes (Signed)
Pt states she was "bitten by something." Occurred today. Pt has reddened area on her upper abdomen.

## 2017-06-19 IMAGING — US US SOFT TISSUE HEAD/NECK
1 series · 14 of 25 positions shown · non-contrast
Comparison: None.

CLINICAL DATA: Thyromegaly on physical examination.

EXAM:
THYROID ULTRASOUND
TECHNIQUE: Ultrasound examination of the thyroid gland and adjacent soft
tissues was performed.

[Series 1: us soft tissue head/neck · 0.06mm/px · 14 of 35 slices shown]
[im 1/35]
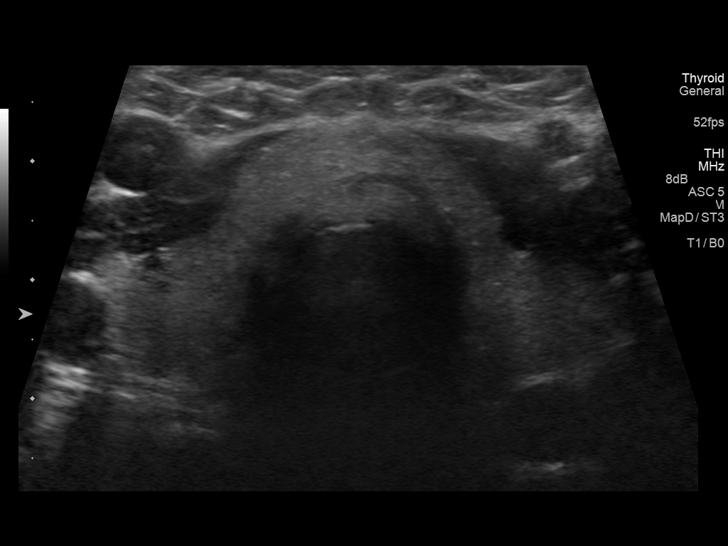
[im 3/35]
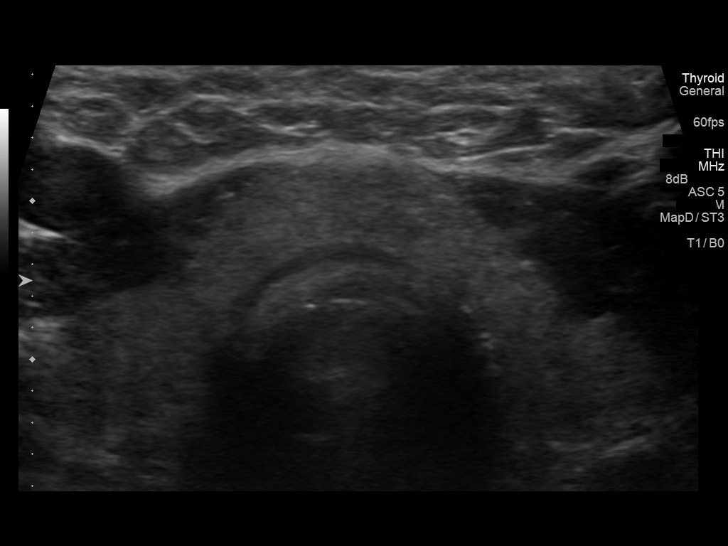
[im 6/35]
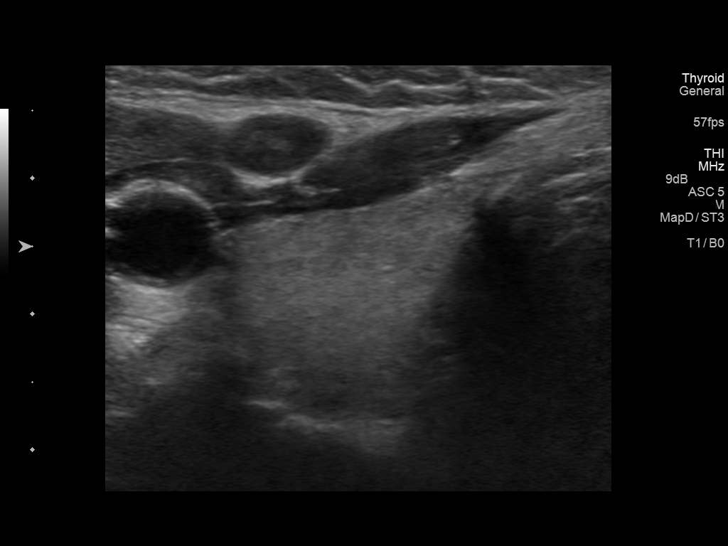
[im 9/35]
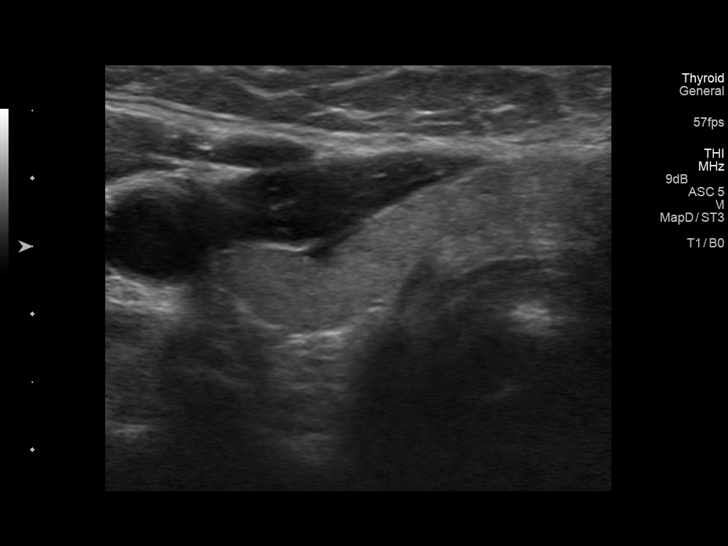
[im 12/35]
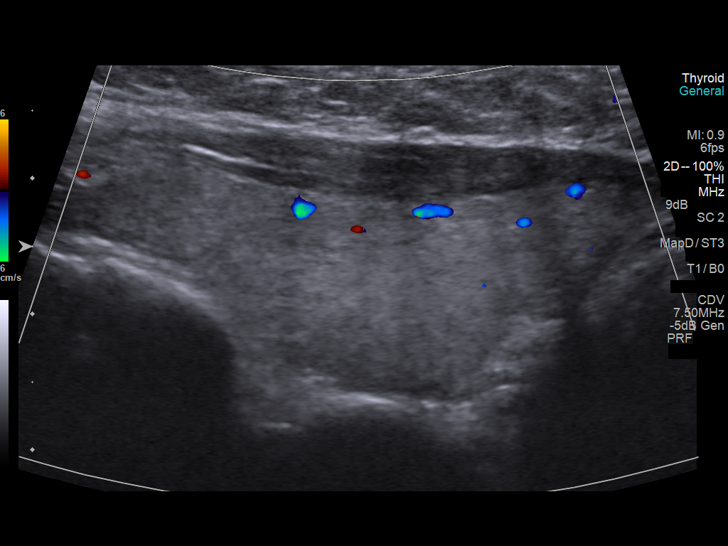
[im 13/35]
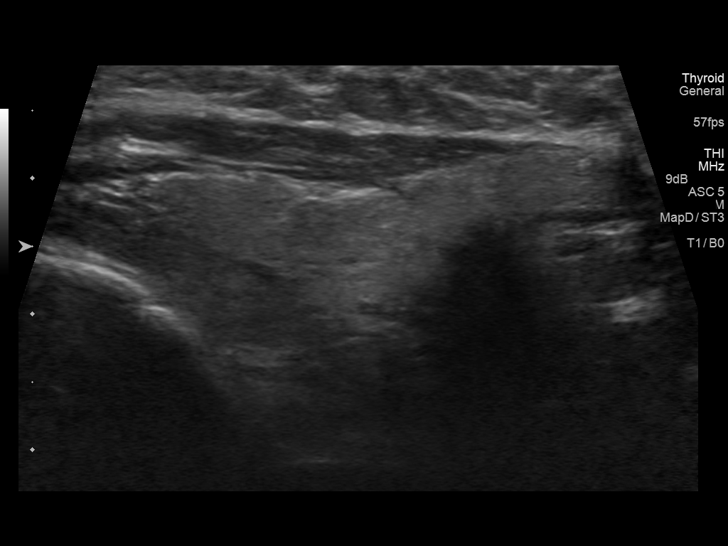
[im 16/35]
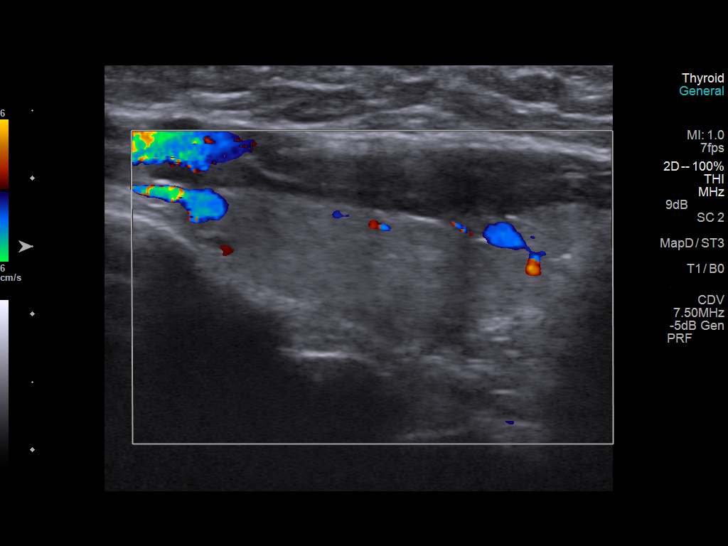
[im 19/35]
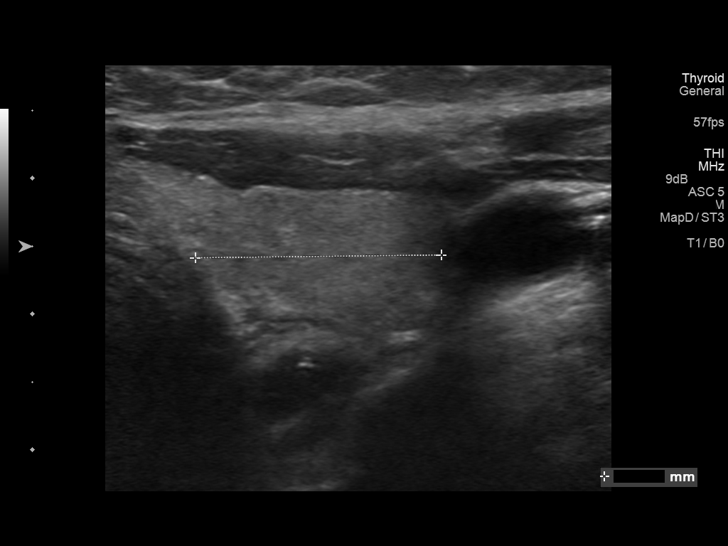
[im 22/35]
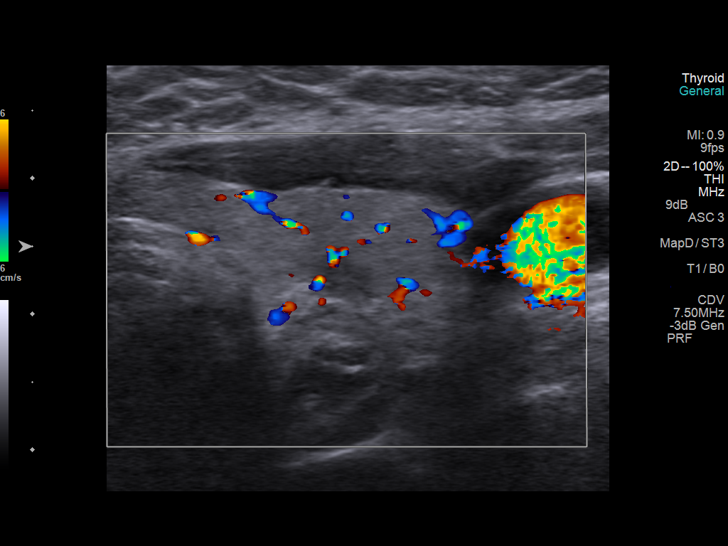
[im 23/35]
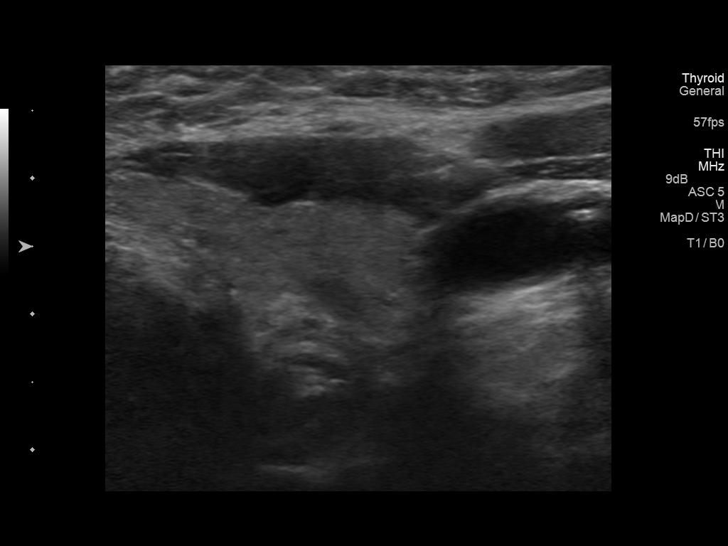
[im 26/35]
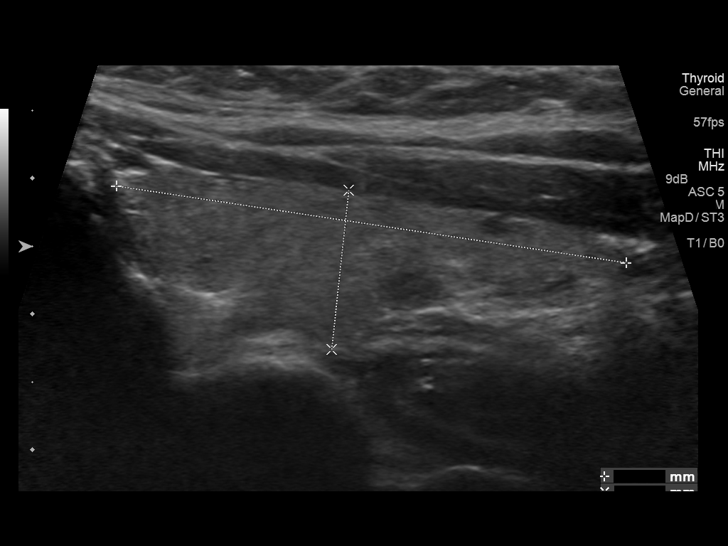
[im 29/35]
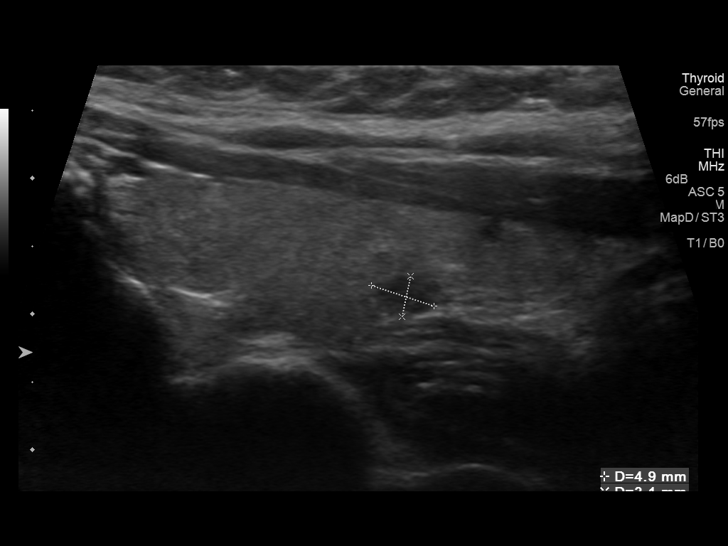
[im 32/35]
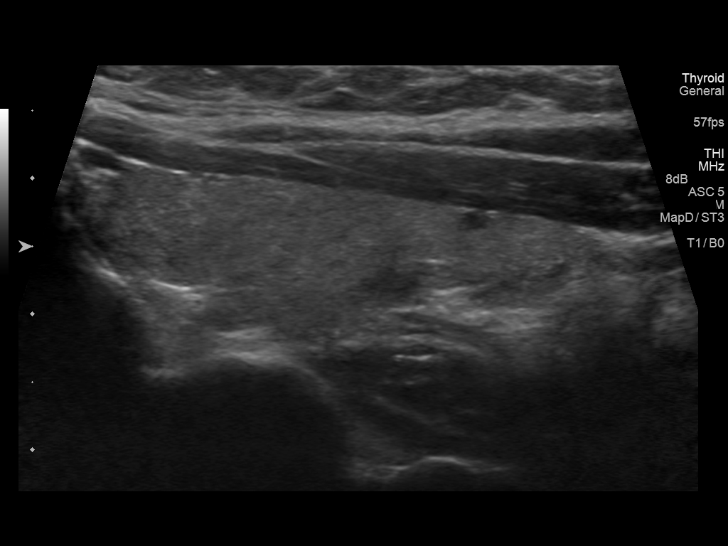
[im 35/35]
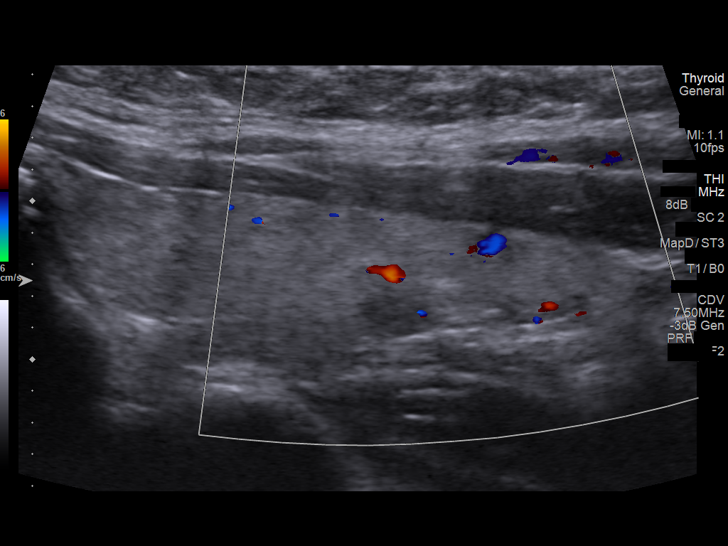

[14 of 25 positions shown; findings below may reference images not displayed]

FINDINGS: Right thyroid lobe

Measurements: 4.0 x 1.5 x 1.7 cm. Normal size of the right lobe with
normal parenchymal echotexture. No nodules, abnormal vascularity or
calcifications.

Left thyroid lobe

Measurements: 3.8 x 1.2 x 1.8 cm. Normal size of left lobe with
mildly heterogeneous parenchyma echotexture. Small hypoechoic nodule
in the inferior left lobe measures 0.5 x 0.3 x 0.5 cm and is
noncalcified.

Isthmus

Thickness: 0.5 cm. The thyroid isthmus is mildly thickened. No
nodules detected.

Lymphadenopathy

None visualized.
IMPRESSION: Normal thyroid size with mild thickening of the isthmus. No
suspicious nodules are identified. A 0.5 cm hypoechoic nodule in the
inferior left lobe is likely benign but should be followed with
ultrasound since there are no prior studies. Recommend follow-up
ultrasound in 12 months.

## 2018-10-10 ENCOUNTER — Other Ambulatory Visit: Payer: Self-pay

## 2018-10-10 DIAGNOSIS — Z20822 Contact with and (suspected) exposure to covid-19: Secondary | ICD-10-CM

## 2018-10-12 LAB — NOVEL CORONAVIRUS, NAA: SARS-CoV-2, NAA: NOT DETECTED

## 2019-03-08 DIAGNOSIS — M545 Low back pain: Secondary | ICD-10-CM | POA: Diagnosis not present

## 2019-03-08 DIAGNOSIS — Z6835 Body mass index (BMI) 35.0-35.9, adult: Secondary | ICD-10-CM | POA: Diagnosis not present

## 2019-03-11 DIAGNOSIS — M545 Low back pain: Secondary | ICD-10-CM | POA: Diagnosis not present

## 2019-03-11 DIAGNOSIS — Z6835 Body mass index (BMI) 35.0-35.9, adult: Secondary | ICD-10-CM | POA: Diagnosis not present

## 2019-06-16 DIAGNOSIS — M65332 Trigger finger, left middle finger: Secondary | ICD-10-CM | POA: Diagnosis not present

## 2019-06-16 DIAGNOSIS — G5601 Carpal tunnel syndrome, right upper limb: Secondary | ICD-10-CM | POA: Diagnosis not present

## 2019-06-16 DIAGNOSIS — Z6837 Body mass index (BMI) 37.0-37.9, adult: Secondary | ICD-10-CM | POA: Diagnosis not present

## 2019-07-15 ENCOUNTER — Encounter: Payer: Self-pay | Admitting: Orthopaedic Surgery

## 2019-07-15 ENCOUNTER — Other Ambulatory Visit: Payer: Self-pay

## 2019-07-15 ENCOUNTER — Ambulatory Visit (INDEPENDENT_AMBULATORY_CARE_PROVIDER_SITE_OTHER): Payer: BLUE CROSS/BLUE SHIELD | Admitting: Orthopaedic Surgery

## 2019-07-15 VITALS — Ht 68.0 in | Wt 240.0 lb

## 2019-07-15 DIAGNOSIS — R200 Anesthesia of skin: Secondary | ICD-10-CM

## 2019-07-15 NOTE — Progress Notes (Signed)
Office Visit Note   Patient: Sandra Terrell           Date of Birth: 1965-09-04           MRN: [id number] Visit Date: 07/15/2019              Requested by: Practice, [place],   [id number] PCP: Practice, [facility]   Assessment & Plan: Visit Diagnoses:  1. Numbness of right hand     Plan: We will set patient up for nerve conduction velocities rule out carpal tunnel syndrome right hand.  She has some x-rays symptomatic is failed splinting anti-inflammatories.  We discussed possibility of carpal tunnel release right hand think time is trigger finger A1 pulley release in the left long finger since it has been actively triggering for many months.  We will see her back after electrical test she understands will be a delay due to backup in scheduling for the test.  Follow-up with me after the test.  Follow-Up Instructions: No follow-ups on file.   Orders:  Orders Placed This Encounter  Procedures  . Ambulatory referral to Physical Medicine Rehab   No orders of the defined types were placed in this encounter.     Procedures: No procedures performed   Clinical Data: No additional findings.   Subjective: Chief Complaint  Patient presents with  . Right Hand - Pain  . Left Hand - Pain  . Right Foot - Pain  . Left Foot - Pain    HPI 54 year old female with type 2 diabetes with diagnosis of probable right carpal tunnel syndrome also left hand long trigger finger.  She also mentions that she has some pain in her feet with pain on her right heel and also some pain in her left foot medially.  She is type II diabetic on insulin.  When she stated cortisone in the past both orally as well as intramuscular when she had some sciatic and ran her sugars up which was a problem for her.  She has not had electrical test for carpal tunnel.  She notes that she drops objects she wear a splint at night.  Splint allows her to sleep through the night without waking  up with her right hand.  Review of Systems 3 hyperlipidemia type 2 diabetes hypertension past history of sciatica not currently symptomatic.  Right hand numbness and left long trigger finger all other systems are negative.   Objective: Vital Signs: Ht 5\' 8"  (1.727 m)   Wt 240 lb (108.9 kg)   BMI 36.49 kg/m   Physical Exam Constitutional:      Appearance: She is well-developed.  HENT:     Head: Normocephalic.     Right Ear: External ear normal.     Left Ear: External ear normal.  Eyes:     Pupils: Pupils are equal, round, and reactive to light.  Neck:     Thyroid: No thyromegaly.     Trachea: No tracheal deviation.  Cardiovascular:     Rate and Rhythm: Normal rate.  Pulmonary:     Effort: Pulmonary effort is normal.  Abdominal:     Palpations: Abdomen is soft.  Skin:    General: Skin is warm and dry.  Neurological:     Mental Status: She is alert and oriented to person, place, and time.  Psychiatric:        Behavior: Behavior normal.     Ortho Exam patient has tenderness of the A1  pulley left long finger with inability to flex to distal palmar crease.  No tenderness over the A2 or A4 pulley.  No tenderness over the carpal canal left hand she is left-hand dominant.  Negative Phalen's.  Right hand shows some mild thenar atrophy without hypothenar atrophy.  Positive carpal compression test.  Good cervical range of motion no brachial plexus tenderness.  Specialty Comments:  No specialty comments available.  Imaging: No results found.   PMFS History: Patient Active Problem List   Diagnosis Date Noted  . Renal mass, right 08/25/2015  . Hyperlipidemia 04/26/2015  . Morbid obesity due to excess calories ([place]) 04/26/2015  . Essential hypertension, benign 03/23/2015  . Uncontrolled type 2 diabetes mellitus without complication, with long-term current use of insulin 03/23/2015   Past Medical History:  Diagnosis Date  . Arthritis   . Diabetes mellitus without  complication ([place])   . Hypertension     Family History  Problem Relation Age of Onset  . Diabetes Mother   . Hypertension Mother   . Cancer Father   . Diabetes Sister   . Hypertension Sister     Past Surgical History:  Procedure Laterality Date  . ABDOMINAL HYSTERECTOMY    . CARPAL TUNNEL RELEASE Left   . ROBOTIC ASSITED PARTIAL NEPHRECTOMY Right 08/25/2015   Procedure: XI ROBOTIC ASSITED RIGHT PARTIAL NEPHRECTOMY;  Surgeon: Cleon Gustin, MD;  Location: [facility];  Service: Urology;  Laterality: Right;   Social History   Occupational History  . Not on file  Tobacco Use  . Smoking status: Never Smoker  . Smokeless tobacco: Never Used  Substance and Sexual Activity  . Alcohol use: No  . Drug use: No  . Sexual activity: Yes    Birth control/protection: Surgical

## 2019-07-19 ENCOUNTER — Encounter: Payer: Self-pay | Admitting: Physical Medicine & Rehabilitation

## 2019-08-05 ENCOUNTER — Encounter
Payer: BC Managed Care – PPO | Attending: Physical Medicine & Rehabilitation | Admitting: Physical Medicine & Rehabilitation

## 2019-08-05 ENCOUNTER — Encounter: Payer: Self-pay | Admitting: Physical Medicine & Rehabilitation

## 2019-08-05 ENCOUNTER — Other Ambulatory Visit: Payer: Self-pay

## 2019-08-05 VITALS — BP 158/77 | HR 85 | Temp 98.8°F | Ht 68.0 in | Wt 240.6 lb

## 2019-08-05 DIAGNOSIS — G5601 Carpal tunnel syndrome, right upper limb: Secondary | ICD-10-CM | POA: Diagnosis not present

## 2019-08-05 DIAGNOSIS — G5602 Carpal tunnel syndrome, left upper limb: Secondary | ICD-10-CM | POA: Diagnosis not present

## 2019-08-05 NOTE — Progress Notes (Signed)
Please see media tab for full results 

## 2019-08-30 ENCOUNTER — Telehealth: Payer: Self-pay | Admitting: Orthopaedic Surgery

## 2019-09-08 ENCOUNTER — Ambulatory Visit (INDEPENDENT_AMBULATORY_CARE_PROVIDER_SITE_OTHER): Payer: BC Managed Care – PPO | Admitting: Orthopaedic Surgery

## 2019-09-08 ENCOUNTER — Encounter: Payer: Self-pay | Admitting: Orthopaedic Surgery

## 2019-09-08 VITALS — Ht 68.0 in | Wt 240.0 lb

## 2019-09-08 DIAGNOSIS — G5601 Carpal tunnel syndrome, right upper limb: Secondary | ICD-10-CM

## 2019-09-08 DIAGNOSIS — M65332 Trigger finger, left middle finger: Secondary | ICD-10-CM | POA: Diagnosis not present

## 2019-09-08 NOTE — Progress Notes (Signed)
Office Visit Note   Patient: Sandra Terrell           Date of Birth: 03-19-1965           MRN: [id number] Visit Date: 09/08/2019              Requested by: [facility] [place],  [place] [id number] PCP: [facility]   Assessment & Plan: Visit Diagnoses:  1. Carpal tunnel syndrome of right wrist   2. Trigger finger, left middle finger     Plan: Patient's husband recently passed and she is having to deal with bills waiting on some insurance money and other funds come in and help pay her bills.  When she is in a better financial position she like to proceed with right carpal tunnel release and also left long trigger finger release at the same operative setting as an outpatient.  Procedure discussed she will call when she is ready to schedule.  Follow-Up Instructions: No follow-ups on file.   Orders:  No orders of the defined types were placed in this encounter.  No orders of the defined types were placed in this encounter.     Procedures: No procedures performed   Clinical Data: No additional findings.   Subjective: Chief Complaint  Patient presents with  . Right Hand - Follow-up    EMG/NCS review  . Left Hand - Follow-up    HPI patient returns post electrical test.  She has had previous carpal tunnel release done in [place] around 2008.  Electrical test show some postop changes from this but no evidence of radiculopathy or plexopathy.  Opposite right hand shows carpal tunnel compression with positive EMG/NCV for moderate CTS.  She is continuing to have more symptoms of the right hand numbness tingling dropping objects that wakes her up at night.  She is also had left long finger triggering.  Patient is a diabetic she is on Lantus insulin.  Review of Systems 14 point system update unchanged from 07/15/2019 other than as mentioned HPI.   Objective: Vital Signs: Ht 5\' 8"  (1.727 m)   Wt 240 lb (108.9 kg)   BMI 36.49 kg/m   Physical  Exam Constitutional:      Appearance: She is well-developed.  HENT:     Head: Normocephalic.     Right Ear: External ear normal.     Left Ear: External ear normal.  Eyes:     Pupils: Pupils are equal, round, and reactive to light.  Neck:     Thyroid: No thyromegaly.     Trachea: No tracheal deviation.  Cardiovascular:     Rate and Rhythm: Normal rate.  Pulmonary:     Effort: Pulmonary effort is normal.  Abdominal:     Palpations: Abdomen is soft.  Skin:    General: Skin is warm and dry.  Neurological:     Mental Status: She is alert and oriented to person, place, and time.  Psychiatric:        Behavior: Behavior normal.     Ortho Exam well-healed carpal tunnel release incision left hand.  Right hand has positive Phalen's positive carpal compression test.  Specialty Comments:  No specialty comments available.  Imaging: No results found.   PMFS History: Patient Active Problem List   Diagnosis Date Noted  . Trigger finger, left middle finger 09/09/2019  . Carpal tunnel syndrome of right wrist 08/05/2019  . Renal mass, right 08/25/2015  . Hyperlipidemia 04/26/2015  . Morbid  obesity due to excess calories ([place]) 04/26/2015  . Essential hypertension, benign 03/23/2015  . Uncontrolled type 2 diabetes mellitus without complication, with long-term current use of insulin 03/23/2015   Past Medical History:  Diagnosis Date  . Arthritis   . Diabetes mellitus without complication ([place])   . Hypertension     Family History  Problem Relation Age of Onset  . Diabetes Mother   . Hypertension Mother   . Cancer Father   . Diabetes Sister   . Hypertension Sister     Past Surgical History:  Procedure Laterality Date  . ABDOMINAL HYSTERECTOMY    . CARPAL TUNNEL RELEASE Left   . ROBOTIC ASSITED PARTIAL NEPHRECTOMY Right 08/25/2015   Procedure: XI ROBOTIC ASSITED RIGHT PARTIAL NEPHRECTOMY;  Surgeon: Cleon Gustin, MD;  Location: [facility];  Service: Urology;  Laterality:  Right;   Social History   Occupational History  . Not on file  Tobacco Use  . Smoking status: Never Smoker  . Smokeless tobacco: Never Used  Substance and Sexual Activity  . Alcohol use: No  . Drug use: No  . Sexual activity: Yes    Birth control/protection: Surgical

## 2019-09-09 DIAGNOSIS — M65332 Trigger finger, left middle finger: Secondary | ICD-10-CM | POA: Insufficient documentation

## 2019-09-24 DIAGNOSIS — E782 Mixed hyperlipidemia: Secondary | ICD-10-CM | POA: Diagnosis not present

## 2019-09-24 DIAGNOSIS — Z6837 Body mass index (BMI) 37.0-37.9, adult: Secondary | ICD-10-CM | POA: Diagnosis not present

## 2019-09-24 DIAGNOSIS — E1165 Type 2 diabetes mellitus with hyperglycemia: Secondary | ICD-10-CM | POA: Diagnosis not present

## 2019-09-24 DIAGNOSIS — R609 Edema, unspecified: Secondary | ICD-10-CM | POA: Diagnosis not present

## 2019-10-29 ENCOUNTER — Other Ambulatory Visit: Payer: Self-pay

## 2019-11-03 ENCOUNTER — Telehealth: Payer: Self-pay | Admitting: Orthopaedic Surgery

## 2019-11-03 NOTE — Telephone Encounter (Signed)
Pls advise. Thanks.  

## 2019-11-03 NOTE — Telephone Encounter (Signed)
I called. 2 months estimate which was just what her left CTR OOW time was.

## 2019-11-03 NOTE — Telephone Encounter (Signed)
Patient is scheduled 11-15-19 as [place] with Dr. Lorin Mercy for the following procedure(s):    RIGHT carpal tunnel release and LEFT long trigger finger release.   Patient would like to know the down-time.  She works as an Agricultural consultant at [employer]. She is trying to get an idea as to when she will be returning to work.  Post op appointment is scheduled 11-25-19 with Dr. Lorin Mercy in [place].    Pt's cb  [phone]

## 2019-11-08 ENCOUNTER — Encounter (HOSPITAL_BASED_OUTPATIENT_CLINIC_OR_DEPARTMENT_OTHER): Payer: Self-pay | Admitting: Orthopaedic Surgery

## 2019-11-08 ENCOUNTER — Other Ambulatory Visit: Payer: Self-pay

## 2019-11-09 ENCOUNTER — Inpatient Hospital Stay: Payer: BC Managed Care – PPO | Admitting: Orthopaedic Surgery

## 2019-11-11 ENCOUNTER — Encounter (HOSPITAL_BASED_OUTPATIENT_CLINIC_OR_DEPARTMENT_OTHER)
Admission: RE | Admit: 2019-11-11 | Discharge: 2019-11-11 | Disposition: A | Discharge status: Home / Self Care | Payer: BC Managed Care – PPO | Source: Ambulatory Visit | Attending: Orthopaedic Surgery | Admitting: Orthopaedic Surgery

## 2019-11-11 ENCOUNTER — Other Ambulatory Visit (HOSPITAL_COMMUNITY)
Admission: RE | Admit: 2019-11-11 | Discharge: 2019-11-11 | Disposition: A | Discharge status: Home / Self Care | Payer: BC Managed Care – PPO | Source: Ambulatory Visit | Attending: Orthopaedic Surgery | Admitting: Orthopaedic Surgery

## 2019-11-11 DIAGNOSIS — Z20822 Contact with and (suspected) exposure to covid-19: Secondary | ICD-10-CM | POA: Insufficient documentation

## 2019-11-11 DIAGNOSIS — Z01818 Encounter for other preprocedural examination: Secondary | ICD-10-CM | POA: Insufficient documentation

## 2019-11-11 DIAGNOSIS — Z01812 Encounter for preprocedural laboratory examination: Secondary | ICD-10-CM | POA: Insufficient documentation

## 2019-11-11 LAB — BASIC METABOLIC PANEL
Anion gap: 9 (ref 5–15)
BUN: 15 mg/dL (ref 6–20)
CO2: 25 mmol/L (ref 22–32)
Calcium: 9.8 mg/dL (ref 8.9–10.3)
Chloride: 105 mmol/L (ref 98–111)
Creatinine, Ser: 0.9 mg/dL (ref 0.44–1.00)
GFR, Estimated: >60 mL/min (ref >60)
Glucose, Bld: 114 mg/dL — ABNORMAL HIGH (ref 70–99)
Potassium: 3.9 mmol/L (ref 3.5–5.1)
Sodium: 139 mmol/L (ref 135–145)

## 2019-11-11 LAB — SARS CORONAVIRUS 2 (TAT 6-24 HRS): SARS Coronavirus 2: NEGATIVE

## 2019-11-15 ENCOUNTER — Ambulatory Visit (HOSPITAL_BASED_OUTPATIENT_CLINIC_OR_DEPARTMENT_OTHER): Payer: BC Managed Care – PPO | Admitting: Certified Registered"

## 2019-11-15 ENCOUNTER — Encounter (HOSPITAL_BASED_OUTPATIENT_CLINIC_OR_DEPARTMENT_OTHER)
Admission: RE | Disposition: A | Discharge status: Home / Self Care | Payer: Self-pay | Source: Home / Self Care | Attending: Orthopaedic Surgery

## 2019-11-15 ENCOUNTER — Other Ambulatory Visit: Payer: Self-pay

## 2019-11-15 ENCOUNTER — Encounter (HOSPITAL_BASED_OUTPATIENT_CLINIC_OR_DEPARTMENT_OTHER): Payer: Self-pay | Admitting: Orthopaedic Surgery

## 2019-11-15 ENCOUNTER — Ambulatory Visit (HOSPITAL_BASED_OUTPATIENT_CLINIC_OR_DEPARTMENT_OTHER)
Admission: RE | Admit: 2019-11-15 | Discharge: 2019-11-15 | Disposition: A | Discharge status: Home / Self Care | Payer: BC Managed Care – PPO | Attending: Orthopaedic Surgery | Admitting: Orthopaedic Surgery

## 2019-11-15 DIAGNOSIS — Z809 Family history of malignant neoplasm, unspecified: Secondary | ICD-10-CM | POA: Insufficient documentation

## 2019-11-15 DIAGNOSIS — E119 Type 2 diabetes mellitus without complications: Secondary | ICD-10-CM | POA: Diagnosis not present

## 2019-11-15 DIAGNOSIS — M65332 Trigger finger, left middle finger: Secondary | ICD-10-CM | POA: Diagnosis not present

## 2019-11-15 DIAGNOSIS — G5601 Carpal tunnel syndrome, right upper limb: Secondary | ICD-10-CM | POA: Insufficient documentation

## 2019-11-15 DIAGNOSIS — Z6836 Body mass index (BMI) 36.0-36.9, adult: Secondary | ICD-10-CM | POA: Diagnosis not present

## 2019-11-15 DIAGNOSIS — Z90710 Acquired absence of both cervix and uterus: Secondary | ICD-10-CM | POA: Insufficient documentation

## 2019-11-15 DIAGNOSIS — Z905 Acquired absence of kidney: Secondary | ICD-10-CM | POA: Diagnosis not present

## 2019-11-15 DIAGNOSIS — Z8249 Family history of ischemic heart disease and other diseases of the circulatory system: Secondary | ICD-10-CM | POA: Diagnosis not present

## 2019-11-15 DIAGNOSIS — Z794 Long term (current) use of insulin: Secondary | ICD-10-CM | POA: Insufficient documentation

## 2019-11-15 DIAGNOSIS — I10 Essential (primary) hypertension: Secondary | ICD-10-CM | POA: Diagnosis not present

## 2019-11-15 DIAGNOSIS — Z833 Family history of diabetes mellitus: Secondary | ICD-10-CM | POA: Insufficient documentation

## 2019-11-15 DIAGNOSIS — E785 Hyperlipidemia, unspecified: Secondary | ICD-10-CM | POA: Insufficient documentation

## 2019-11-15 DIAGNOSIS — M1990 Unspecified osteoarthritis, unspecified site: Secondary | ICD-10-CM | POA: Insufficient documentation

## 2019-11-15 DIAGNOSIS — E1165 Type 2 diabetes mellitus with hyperglycemia: Secondary | ICD-10-CM | POA: Diagnosis not present

## 2019-11-15 HISTORY — PX: TRIGGER FINGER RELEASE: SHX641

## 2019-11-15 HISTORY — PX: CARPAL TUNNEL RELEASE: SHX101

## 2019-11-15 LAB — GLUCOSE, CAPILLARY
Glucose-Capillary: 240 mg/dL — ABNORMAL HIGH (ref 70–99)
Glucose-Capillary: 233 mg/dL — ABNORMAL HIGH (ref 70–99)

## 2019-11-15 SURGERY — CARPAL TUNNEL RELEASE
Anesthesia: General | Site: Finger | Laterality: Right

## 2019-11-15 MED ORDER — ONDANSETRON HCL 4 MG/2ML IJ SOLN
INTRAMUSCULAR | Status: DC | PRN
  Administered 2019-11-15: 4 mg via INTRAVENOUS

## 2019-11-15 MED ORDER — MEPERIDINE HCL 25 MG/ML IJ SOLN: 6.2500 mg | INTRAMUSCULAR | Status: DC | PRN

## 2019-11-15 MED ORDER — LIDOCAINE 2% (20 MG/ML) 5 ML SYRINGE
INTRAMUSCULAR | Status: DC | PRN
  Administered 2019-11-15: 60 mg via INTRAVENOUS

## 2019-11-15 MED ORDER — LIDOCAINE HCL 1 % IJ SOLN
INTRAMUSCULAR | Status: DC | PRN
  Administered 2019-11-15: 7 mL

## 2019-11-15 MED ORDER — HYDROCODONE-ACETAMINOPHEN 5-325 MG PO TABS
1.0000 | ORAL_TABLET | ORAL | 0 refills | Status: DC | PRN
Start: 2019-11-15 — End: 2019-11-25

## 2019-11-15 MED ORDER — CEFAZOLIN SODIUM-DEXTROSE 2-4 GM/100ML-% IV SOLN
INTRAVENOUS | Status: AC
  Filled 2019-11-15: qty 100

## 2019-11-15 MED ORDER — LACTATED RINGERS IV SOLN: INTRAVENOUS | Status: DC | PRN

## 2019-11-15 MED ORDER — MIDAZOLAM HCL 2 MG/2ML IJ SOLN
INTRAMUSCULAR | Status: AC
  Filled 2019-11-15: qty 2

## 2019-11-15 MED ORDER — OXYCODONE HCL 5 MG/5ML PO SOLN: 5.0000 mg | Freq: Once | ORAL | Status: AC | PRN

## 2019-11-15 MED ORDER — PROPOFOL 10 MG/ML IV BOLUS
INTRAVENOUS | Status: DC | PRN
  Administered 2019-11-15: 200 mg via INTRAVENOUS

## 2019-11-15 MED ORDER — PROMETHAZINE HCL 25 MG/ML IJ SOLN: 6.2500 mg | INTRAMUSCULAR | Status: DC | PRN

## 2019-11-15 MED ORDER — CEFAZOLIN SODIUM-DEXTROSE 2-4 GM/100ML-% IV SOLN
2.0000 g | INTRAVENOUS | Status: AC
  Administered 2019-11-15: 2 g via INTRAVENOUS

## 2019-11-15 MED ORDER — MIDAZOLAM HCL 5 MG/5ML IJ SOLN
INTRAMUSCULAR | Status: DC | PRN
  Administered 2019-11-15: 2 mg via INTRAVENOUS

## 2019-11-15 MED ORDER — GLYCOPYRROLATE PF 0.2 MG/ML IJ SOSY
PREFILLED_SYRINGE | INTRAMUSCULAR | Status: AC
  Filled 2019-11-15: qty 1

## 2019-11-15 MED ORDER — PROPOFOL 10 MG/ML IV BOLUS
INTRAVENOUS | Status: AC
  Filled 2019-11-15: qty 20

## 2019-11-15 MED ORDER — HYDROCODONE-ACETAMINOPHEN 5-325 MG PO TABS
1.0000 | ORAL_TABLET | Freq: Four times a day (QID) | ORAL | 0 refills | Status: DC | PRN
Start: 2019-11-15 — End: 2019-11-15

## 2019-11-15 MED ORDER — HYDROMORPHONE HCL 1 MG/ML IJ SOLN: 0.2500 mg | INTRAMUSCULAR | Status: DC | PRN

## 2019-11-15 MED ORDER — PHENYLEPHRINE 40 MCG/ML (10ML) SYRINGE FOR IV PUSH (FOR BLOOD PRESSURE SUPPORT)
PREFILLED_SYRINGE | INTRAVENOUS | Status: AC
  Filled 2019-11-15: qty 10

## 2019-11-15 MED ORDER — OXYCODONE HCL 5 MG PO TABS
5.0000 mg | ORAL_TABLET | Freq: Once | ORAL | Status: AC | PRN
  Administered 2019-11-15: 5 mg via ORAL

## 2019-11-15 MED ORDER — GLYCOPYRROLATE PF 0.2 MG/ML IJ SOSY
PREFILLED_SYRINGE | INTRAMUSCULAR | Status: DC | PRN
  Administered 2019-11-15: .2 mg via INTRAVENOUS

## 2019-11-15 MED ORDER — LIDOCAINE 2% (20 MG/ML) 5 ML SYRINGE
INTRAMUSCULAR | Status: AC
  Filled 2019-11-15: qty 5

## 2019-11-15 MED ORDER — OXYCODONE HCL 5 MG PO TABS
ORAL_TABLET | ORAL | Status: AC
  Filled 2019-11-15: qty 1

## 2019-11-15 MED ORDER — ONDANSETRON HCL 4 MG/2ML IJ SOLN
INTRAMUSCULAR | Status: AC
  Filled 2019-11-15: qty 2

## 2019-11-15 MED ORDER — LACTATED RINGERS IV SOLN: INTRAVENOUS | Status: DC

## 2019-11-15 MED ORDER — FENTANYL CITRATE (PF) 100 MCG/2ML IJ SOLN
INTRAMUSCULAR | Status: DC | PRN
  Administered 2019-11-15: 25 ug via INTRAVENOUS

## 2019-11-15 MED ORDER — PHENYLEPHRINE 40 MCG/ML (10ML) SYRINGE FOR IV PUSH (FOR BLOOD PRESSURE SUPPORT)
PREFILLED_SYRINGE | INTRAVENOUS | Status: DC | PRN
  Administered 2019-11-15 (×5): 80 ug via INTRAVENOUS

## 2019-11-15 MED ORDER — AMISULPRIDE (ANTIEMETIC) 5 MG/2ML IV SOLN: 10.0000 mg | Freq: Once | INTRAVENOUS | Status: DC | PRN

## 2019-11-15 MED ORDER — FENTANYL CITRATE (PF) 100 MCG/2ML IJ SOLN
INTRAMUSCULAR | Status: AC
  Filled 2019-11-15: qty 2

## 2019-11-15 SURGICAL SUPPLY — 53 items
BENZOIN TINCTURE PRP APPL 2/3 (GAUZE/BANDAGES/DRESSINGS) IMPLANT
BLADE SURG 11 STRL SS (BLADE) IMPLANT
BLADE SURG 15 STRL LF DISP TIS (BLADE) ×2 IMPLANT
BLADE SURG 15 STRL SS (BLADE) ×1
BNDG COHESIVE 2X5 TAN STRL LF (GAUZE/BANDAGES/DRESSINGS) ×3 IMPLANT
BNDG CONFORM 3 STRL LF (GAUZE/BANDAGES/DRESSINGS) ×3 IMPLANT
BNDG ELASTIC 4X5.8 VLCR STR LF (GAUZE/BANDAGES/DRESSINGS) ×3 IMPLANT
BNDG ESMARK 4X9 LF (GAUZE/BANDAGES/DRESSINGS) IMPLANT
BRUSH SCRUB EZ PLAIN DRY (MISCELLANEOUS) IMPLANT
CORD BIPOLAR FORCEPS 12FT (ELECTRODE) ×6 IMPLANT
COVER BACK TABLE 60X90IN (DRAPES) ×3 IMPLANT
COVER MAYO STAND STRL (DRAPES) ×6 IMPLANT
COVER WAND RF STERILE (DRAPES) IMPLANT
CUFF TOURN SGL QUICK 18X4 (TOURNIQUET CUFF) ×3 IMPLANT
DECANTER SPIKE VIAL GLASS SM (MISCELLANEOUS) IMPLANT
DRAPE C-ARM 42X72 X-RAY (DRAPES) IMPLANT
DRAPE EXTREMITY T 121X128X90 (DISPOSABLE) ×6 IMPLANT
DRAPE OEC MINIVIEW 54X84 (DRAPES) IMPLANT
DRAPE SURG 17X23 STRL (DRAPES) ×3 IMPLANT
DURAPREP 26ML APPLICATOR (WOUND CARE) ×6 IMPLANT
GAUZE SPONGE 4X4 12PLY STRL (GAUZE/BANDAGES/DRESSINGS) ×6 IMPLANT
GAUZE XEROFORM 1X8 LF (GAUZE/BANDAGES/DRESSINGS) ×3 IMPLANT
GLOVE BIO SURGEON STRL SZ 6.5 (GLOVE) ×3 IMPLANT
GLOVE BIO SURGEON STRL SZ7.5 (GLOVE) ×6 IMPLANT
GLOVE BIOGEL PI IND STRL 7.0 (GLOVE) ×2 IMPLANT
GLOVE BIOGEL PI IND STRL 8 (GLOVE) ×4 IMPLANT
GLOVE BIOGEL PI INDICATOR 7.0 (GLOVE) ×1
GLOVE BIOGEL PI INDICATOR 8 (GLOVE) ×2
GOWN STRL REUS W/ TWL LRG LVL3 (GOWN DISPOSABLE) ×2 IMPLANT
GOWN STRL REUS W/ TWL XL LVL3 (GOWN DISPOSABLE) ×4 IMPLANT
GOWN STRL REUS W/TWL LRG LVL3 (GOWN DISPOSABLE) ×1
GOWN STRL REUS W/TWL XL LVL3 (GOWN DISPOSABLE) ×2
LOOP VESSEL MAXI BLUE (MISCELLANEOUS) IMPLANT
NEEDLE HYPO 25X1 1.5 SAFETY (NEEDLE) ×3 IMPLANT
NS IRRIG 1000ML POUR BTL (IV SOLUTION) ×3 IMPLANT
PACK BASIN DAY SURGERY FS (CUSTOM PROCEDURE TRAY) ×3 IMPLANT
PAD CAST 3X4 CTTN HI CHSV (CAST SUPPLIES) IMPLANT
PADDING CAST ABS 4INX4YD NS (CAST SUPPLIES)
PADDING CAST ABS COTTON 4X4 ST (CAST SUPPLIES) IMPLANT
PADDING CAST COTTON 3X4 STRL (CAST SUPPLIES)
SHEET MEDIUM DRAPE 40X70 STRL (DRAPES) IMPLANT
SPLINT PLASTER CAST XFAST 3X15 (CAST SUPPLIES) IMPLANT
SPLINT PLASTER XTRA FASTSET 3X (CAST SUPPLIES)
STOCKINETTE 4X48 STRL (DRAPES) ×3 IMPLANT
STRIP CLOSURE SKIN 1/2X4 (GAUZE/BANDAGES/DRESSINGS) IMPLANT
SUT ETHILON 3 0 PS 1 (SUTURE) ×3 IMPLANT
SUT ETHILON 4 0 PS 2 18 (SUTURE) ×6 IMPLANT
SUT ETHILON 5 0 PS 2 18 (SUTURE) ×3 IMPLANT
SUT ETHILON 6 0 P 1 (SUTURE) ×3 IMPLANT
SYR BULB EAR ULCER 3OZ GRN STR (SYRINGE) ×3 IMPLANT
SYR CONTROL 10ML LL (SYRINGE) ×3 IMPLANT
TOWEL GREEN STERILE FF (TOWEL DISPOSABLE) ×6 IMPLANT
UNDERPAD 30X36 HEAVY ABSORB (UNDERPADS AND DIAPERS) ×6 IMPLANT

## 2019-11-15 NOTE — H&P (Signed)
Patient: Sandra Terrell                                          Date of Birth: December 06, 1965                                                    MRN: [id number] Visit Date: 09/08/2019                                                                     Requested by: [facility] [place],  [place] [id number] PCP: [facility]   Assessment & Plan: Visit Diagnoses:  1. Carpal tunnel syndrome of right wrist   2. Trigger finger, left middle finger     Plan: Patient's husband recently passed and she is having to deal with bills waiting on some insurance money and other funds come in and help pay her bills.  When she is in a better financial position she like to proceed with right carpal tunnel release and also left long trigger finger release at the same operative setting as an outpatient.  Procedure discussed she will call when she is ready to schedule.  Follow-Up Instructions: No follow-ups on file.   Orders:  No orders of the defined types were placed in this encounter.  No orders of the defined types were placed in this encounter.     Procedures: No procedures performed   Clinical Data: No additional findings.   Subjective:     Chief Complaint  Patient presents with  . Right Hand - Follow-up    EMG/NCS review  . Left Hand - Follow-up    HPI patient returns post electrical test.  She has had previous carpal tunnel release done in [place] around 2008.  Electrical test show some postop changes from this but no evidence of radiculopathy or plexopathy.  Opposite right hand shows carpal tunnel compression with positive EMG/NCV for moderate CTS.  She is continuing to have more symptoms of the right hand numbness tingling dropping objects that wakes her up at night.  She is also had left long finger triggering.  Patient is a diabetic she is on Lantus insulin.  Review of Systems 14 point system update unchanged from 07/15/2019 other than  as mentioned HPI.   Objective: Vital Signs: Ht 5\' 8"  (1.727 m)   Wt 240 lb (108.9 kg)   BMI 36.49 kg/m   Physical Exam Constitutional:      Appearance: She is well-developed.  HENT:     Head: Normocephalic.     Right Ear: External ear normal.     Left Ear: External ear normal.  Eyes:     Pupils: Pupils are equal, round, and reactive to light.  Neck:     Thyroid: No thyromegaly.     Trachea: No tracheal deviation.  Cardiovascular:     Rate and Rhythm: Normal rate.  Pulmonary:     Effort: Pulmonary effort is normal.  Abdominal:  Palpations: Abdomen is soft.  Skin:    General: Skin is warm and dry.  Neurological:     Mental Status: She is alert and oriented to person, place, and time.  Psychiatric:        Behavior: Behavior normal.     Ortho Exam well-healed carpal tunnel release incision left hand.  Right hand has positive Phalen's positive carpal compression test.  Specialty Comments:  No specialty comments available.  Imaging: No results found.   PMFS History:     Patient Active Problem List   Diagnosis Date Noted  . Trigger finger, left middle finger 09/09/2019  . Carpal tunnel syndrome of right wrist 08/05/2019  . Renal mass, right 08/25/2015  . Hyperlipidemia 04/26/2015  . Morbid obesity due to excess calories ([place]) 04/26/2015  . Essential hypertension, benign 03/23/2015  . Uncontrolled type 2 diabetes mellitus without complication, with long-term current use of insulin 03/23/2015       Past Medical History:  Diagnosis Date  . Arthritis   . Diabetes mellitus without complication ([place])   . Hypertension          Family History  Problem Relation Age of Onset  . Diabetes Mother   . Hypertension Mother   . Cancer Father   . Diabetes Sister   . Hypertension Sister          Past Surgical History:  Procedure Laterality Date  . ABDOMINAL HYSTERECTOMY    . CARPAL TUNNEL RELEASE Left   . ROBOTIC ASSITED PARTIAL  NEPHRECTOMY Right 08/25/2015   Procedure: XI ROBOTIC ASSITED RIGHT PARTIAL NEPHRECTOMY;  Surgeon: Cleon Gustin, MD;  Location: [facility];  Service: Urology;  Laterality: Right;   Social History        Occupational History  . Not on file  Tobacco Use  . Smoking status: Never Smoker  . Smokeless tobacco: Never Used  Substance and Sexual Activity  . Alcohol use: No  . Drug use: No  . Sexual activity: Yes    Birth control/protection: Surgical     Plan  :  Right CTR and left long trigger finger release. Continued symptoms without relief , failed conservative Tx. Risks discussed , she agrees to proceed.

## 2019-11-15 NOTE — Op Note (Signed)
Preop diagnosis: Right carpal tunnel syndrome, left long trigger finger.  Postop diagnosis: Same  Procedure: Right carpal tunnel release.  Left long trigger finger release.  Surgeon: Rodell Perna, MD  Tourniquet: Right forearm tourniquet 6 minutes.  Anesthesia General LMA converted the mask.  +6 cc total Marcaine/lidocaine 1:1 mixture without epi  Procedure: After standard prepping and draping for right arm with forearm tourniquet arm is elevated tourniquet inflated timeout procedure was completed.  Sterile skin marker was used for planned incision using standard anatomy.  Incision stop the distal wrist crease and bipolar cautery was used for hemostasis.  Palmaris brevis was thickened more prominent than normal.  Transverse carpal ligament was opened and followed proximally along the ulnar aspect of the median nerve going from proximal to distal.  Thenar branch had typical distal radial takeoff.  Complete release to the arterial arch in the palm.  Fingertip could be placed proximally with complete release of the transverse carpal ligament.  There was hourglass deformity and the nerve hyperemia tourniquet was deflated.  Bipolar was used for some subcutaneous venous bleeding.  Irrigation with saline solution and skin closure with 3-0 nylon interrupted sutures.  Xeroform for forced tweezers were applied.  Total 4 cc local was used on this side.  Web roll followed by Ace wrap was used for soft dressing.  Opposite left wrist was prepped.  Only IV access was near the radial styloid so 1010 drape was placed distal to this.  Prepping with DuraPrep.  Extremity sheets and drapes.  Sterile skin marker was used and after many timeout transverse incision was made overlying the A1 pulley.  2 g Ancef had been given at the beginning of the case for the carpal tunnel release.  Blunt dissection in the midline with Stephen scissors and then using direct nail tractors going directly down on top of the flexor tendon sheath  and then on the side radial and ulnar to protect the neurovascular bundle exposed flexor tendon sheath.  It was nicked with a scalpel extended with Gerilyn Nestle scissors proximally distally.  There was significant catching any with the patient to sleep passively she still had locking until the A1 pulley was released.  After this there was full flexion extension without catching.  We looked proximally distally make sure we had full release flexor tendon and extended the finger under direct visualization.  Irrigation saline solution 4-0 nylon interrupted suture x3 Xeroform 20 nurse folded 4 x 4 and Coban was applied after cleaning for soft dressing.  Patient tolerated procedure well 2 cc of the Marcaine lidocaine mixture was used on the left hand for the trigger finger release.  Patient tolerated procedure well transfer the care room in stable condition.

## 2019-11-15 NOTE — Transfer of Care (Signed)
Immediate Anesthesia Transfer of Care Note  Patient: Sandra Terrell  Procedure(s) Performed: RIGHT CARPAL TUNNEL RELEASE (Right Arm Lower) LEFT LONG TRIGGER FINGER RELEASE (Left Finger)  Patient Location: PACU  Anesthesia Type:General  Level of Consciousness: awake, alert  and oriented  Airway & Oxygen Therapy: Patient Spontanous Breathing and Patient connected to face mask oxygen  Post-op Assessment: Report given to RN and Post -op Vital signs reviewed and stable  Post vital signs: Reviewed and stable  Last Vitals:  Vitals Value Taken Time  BP 142/71 11/15/19 0830  Temp    Pulse 85 11/15/19 0832  Resp 28 11/15/19 0832  SpO2 100 % 11/15/19 0832  Vitals shown include unvalidated device data.  Last Pain:  Vitals:   11/15/19 0706  TempSrc:   PainSc: 0-No pain         Complications: No complications documented.

## 2019-11-15 NOTE — Interval H&P Note (Signed)
History and Physical Interval Note:  11/15/2019 7:23 AM  Sandra Terrell  has presented today for surgery, with the diagnosis of right carpal tunnel syndrome, left long trigger finger.  The various methods of treatment have been discussed with the patient and family. After consideration of risks, benefits and other options for treatment, the patient has consented to  Procedure(s): RIGHT CARPAL TUNNEL RELEASE (Right) LEFT LONG TRIGGER FINGER RELEASE (Left) as a surgical intervention.  The patient's history has been reviewed, patient examined, no change in status, stable for surgery.  I have reviewed the patient's chart and labs.  Questions were answered to the patient's satisfaction.     [name]

## 2019-11-15 NOTE — Anesthesia Postprocedure Evaluation (Signed)
Anesthesia Post Note  Patient: Sandra Terrell  Procedure(s) Performed: RIGHT CARPAL TUNNEL RELEASE (Right Arm Lower) LEFT LONG TRIGGER FINGER RELEASE (Left Finger)     Patient location during evaluation: PACU Anesthesia Type: General Level of consciousness: awake and alert Pain management: pain level controlled Vital Signs Assessment: post-procedure vital signs reviewed and stable Respiratory status: spontaneous breathing, nonlabored ventilation and respiratory function stable Cardiovascular status: blood pressure returned to baseline and stable Postop Assessment: no apparent nausea or vomiting Anesthetic complications: no   No complications documented.  Last Vitals:  Vitals:   11/15/19 0900 11/15/19 0907  BP: (!) 172/70 (!) 154/64  Pulse: 78 85  Resp: 16 16  Temp:  36.6 C  SpO2: 100% 100%    Last Pain:  Vitals:   11/15/19 1005  TempSrc:   PainSc: [place]

## 2019-11-15 NOTE — Discharge Instructions (Signed)
  Post Anesthesia Home Care Instructions  Activity: Get plenty of rest for the remainder of the day. A responsible individual must stay with you for 24 hours following the procedure.  For the next 24 hours, DO NOT: -Drive a car -Paediatric nurse -Drink alcoholic beverages -Take any medication unless instructed by your physician -Make any legal decisions or sign important papers.  Meals: Start with liquid foods such as gelatin or soup. Progress to regular foods as tolerated. Avoid greasy, spicy, heavy foods. If nausea and/or vomiting occur, drink only clear liquids until the nausea and/or vomiting subsides. Call your physician if vomiting continues.  Special Instructions/Symptoms: Your throat may feel dry or sore from the anesthesia or the breathing tube placed in your throat during surgery. If this causes discomfort, gargle with warm salt water. The discomfort should disappear within 24 hours.  If you had a scopolamine patch placed behind your ear for the management of post- operative nausea and/or vomiting:  1. The medication in the patch is effective for 72 hours, after which it should be removed.  Wrap patch in a tissue and discard in the trash. Wash hands thoroughly with soap and water. 2. You may remove the patch earlier than 72 hours if you experience unpleasant side effects which may include dry mouth, dizziness or visual disturbances. 3. Avoid touching the patch. Wash your hands with soap and water after contact with the patch.    You may remove the dressing on your left hand where he had the trigger finger release the day after surgery wash your hand and then apply Band-Aid over the sutures.  You will need to leave the dressing on your right hand which had the carpal tunnel release intact and to come back to the office in 1 week.  Elevate right hand will help with pain.  Prescription for pain medicine has been sent into your pharmacy.  You will see Dr. Lorin Mercy next week in about 8  days.

## 2019-11-15 NOTE — Anesthesia Preprocedure Evaluation (Signed)
Anesthesia Evaluation  Patient identified by MRN, date of birth, ID band Patient awake    Reviewed: Allergy & Precautions, H&P , NPO status , Patient's Chart, lab work & pertinent test results  Airway Mallampati: II  TM Distance: >3 FB Neck ROM: full    Dental no notable dental hx.    Pulmonary neg pulmonary ROS,    Pulmonary exam normal breath sounds clear to auscultation       Cardiovascular hypertension, Pt. on medications Normal cardiovascular exam Rhythm:regular Rate:Normal     Neuro/Psych    GI/Hepatic   Endo/Other  diabetes, Type 2  Renal/GU      Musculoskeletal  (+) Arthritis , Osteoarthritis,    Abdominal (+) + obese,   Peds  Hematology   Anesthesia Other Findings   Reproductive/Obstetrics                             Anesthesia Physical  Anesthesia Plan  ASA: III  Anesthesia Plan: General   Post-op Pain Management:    Induction: Intravenous  PONV Risk Score and Plan: 3 and Ondansetron, Dexamethasone, Midazolam and Treatment may vary due to age or medical condition  Airway Management Planned: LMA  Additional Equipment:   Intra-op Plan:   Post-operative Plan: Extubation in OR  Informed Consent: I have reviewed the patients History and Physical, chart, labs and discussed the procedure including the risks, benefits and alternatives for the proposed anesthesia with the patient or authorized representative who has indicated his/her understanding and acceptance.     Dental advisory given  Plan Discussed with: CRNA, Anesthesiologist and Surgeon  Anesthesia Plan Comments:         Anesthesia Quick Evaluation

## 2019-11-15 NOTE — Anesthesia Procedure Notes (Signed)
Procedure Name: LMA Insertion Date/Time: 11/15/2019 7:41 AM Performed by: Alain Marion, CRNA Pre-anesthesia Checklist: Patient identified, Emergency Drugs available, Suction available and Patient being monitored Patient Re-evaluated:Patient Re-evaluated prior to induction Oxygen Delivery Method: Circle System Utilized Preoxygenation: Pre-oxygenation with 100% oxygen Induction Type: IV induction Ventilation: Mask ventilation without difficulty LMA: LMA inserted LMA Size: 4.0 Number of attempts: 1 Airway Equipment and Method: Bite block Placement Confirmation: positive ETCO2 Tube secured with: Tape Dental Injury: Teeth and Oropharynx as per pre-operative assessment

## 2019-11-16 ENCOUNTER — Encounter (HOSPITAL_BASED_OUTPATIENT_CLINIC_OR_DEPARTMENT_OTHER): Payer: Self-pay | Admitting: Orthopaedic Surgery

## 2019-11-23 ENCOUNTER — Encounter (HOSPITAL_BASED_OUTPATIENT_CLINIC_OR_DEPARTMENT_OTHER): Payer: Self-pay | Admitting: Orthopaedic Surgery

## 2019-11-25 ENCOUNTER — Other Ambulatory Visit: Payer: Self-pay

## 2019-11-25 ENCOUNTER — Ambulatory Visit (INDEPENDENT_AMBULATORY_CARE_PROVIDER_SITE_OTHER): Payer: BC Managed Care – PPO | Admitting: Orthopaedic Surgery

## 2019-11-25 ENCOUNTER — Encounter: Payer: Self-pay | Admitting: Orthopaedic Surgery

## 2019-11-25 VITALS — Ht 68.0 in | Wt 243.0 lb

## 2019-11-25 DIAGNOSIS — M65332 Trigger finger, left middle finger: Secondary | ICD-10-CM

## 2019-11-25 DIAGNOSIS — G5601 Carpal tunnel syndrome, right upper limb: Secondary | ICD-10-CM

## 2019-11-25 MED ORDER — HYDROCODONE-ACETAMINOPHEN 5-325 MG PO TABS
1.0000 | ORAL_TABLET | ORAL | 0 refills | Status: AC | PRN
Start: 2019-11-25 — End: 2020-11-24

## 2019-11-25 NOTE — Progress Notes (Signed)
Postop carpal tunnel release right and trigger finger release left middle finger.  Sutures harvested left palm from the trigger finger release.  Carpal tunnel incision on the right we will leave sutures in for 1 more week and she will return in 1 week for sutures out.  Risk 1 applied.  She already had Aleve for out for 2 months note given no work x2 months from surgery.  We can adjust this depending on her progress.  Recheck 1 week for suture removal right hand.

## 2019-12-02 ENCOUNTER — Other Ambulatory Visit: Payer: Self-pay

## 2019-12-02 ENCOUNTER — Encounter: Payer: Self-pay | Admitting: Orthopaedic Surgery

## 2019-12-02 ENCOUNTER — Ambulatory Visit (INDEPENDENT_AMBULATORY_CARE_PROVIDER_SITE_OTHER): Payer: BC Managed Care – PPO | Admitting: Orthopaedic Surgery

## 2019-12-02 VITALS — Ht 68.0 in | Wt 243.0 lb

## 2019-12-02 DIAGNOSIS — G5601 Carpal tunnel syndrome, right upper limb: Secondary | ICD-10-CM

## 2019-12-02 NOTE — Progress Notes (Signed)
   Post-Op Visit Note   Patient: Sandra Terrell           Date of Birth: 1965/07/25           MRN: [id number] Visit Date: 12/02/2019 PCP: [facility]   Assessment & Plan: Post right carpal tunnel release.  She is already had the left done in the distant past by another surgeon.  She still has some problems reaching fingertips to distal palmar crease after left long finger A1 pulley release.  We will went over buddy taping working on extension at the PIP joint becomes nearly fully straight.  Chief Complaint:  Chief Complaint  Patient presents with  . Right Hand - Follow-up    11/15/2019 Right CTR  . Left Hand - Follow-up    11/15/2019 Left long finger trigger finger release   Visit Diagnoses:  1. Carpal tunnel syndrome, right upper limb     Plan: Sutures removed right carpal tunnel release.  Recheck 5 weeks she is scheduled to be out of work until December 12 we will check her status when she returns in 5 weeks and then make a determination.  Follow-Up Instructions: Return in about 5 weeks (around 01/06/2020).   Orders:  No orders of the defined types were placed in this encounter.  No orders of the defined types were placed in this encounter.   Imaging: No results found.  PMFS History: Patient Active Problem List   Diagnosis Date Noted  . Trigger finger, left middle finger 09/09/2019  . Carpal tunnel syndrome, right upper limb 08/05/2019  . Renal mass, right 08/25/2015  . Hyperlipidemia 04/26/2015  . Morbid obesity due to excess calories ([place]) 04/26/2015  . Essential hypertension, benign 03/23/2015  . Uncontrolled type 2 diabetes mellitus without complication, with long-term current use of insulin 03/23/2015   Past Medical History:  Diagnosis Date  . Arthritis   . Diabetes mellitus without complication ([place])   . Hypertension    not taking medications    Family History  Problem Relation Age of Onset  . Diabetes Mother   . Hypertension Mother   .  Cancer Father   . Diabetes Sister   . Hypertension Sister     Past Surgical History:  Procedure Laterality Date  . ABDOMINAL HYSTERECTOMY    . CARPAL TUNNEL RELEASE Left   . CARPAL TUNNEL RELEASE Right 11/15/2019   Procedure: RIGHT CARPAL TUNNEL RELEASE;  Surgeon: [name], MD;  Location: [facility];  Service: Orthopedics;  Laterality: Right;  . ROBOTIC ASSITED PARTIAL NEPHRECTOMY Right 08/25/2015   Procedure: XI ROBOTIC ASSITED RIGHT PARTIAL NEPHRECTOMY;  Surgeon: Cleon Gustin, MD;  Location: [facility];  Service: Urology;  Laterality: Right;  . TRIGGER FINGER RELEASE Left 11/15/2019   Procedure: LEFT LONG TRIGGER FINGER RELEASE;  Surgeon: [name], MD;  Location: [facility];  Service: Orthopedics;  Laterality: Left;   Social History   Occupational History  . Not on file  Tobacco Use  . Smoking status: Never Smoker  . Smokeless tobacco: Never Used  Substance and Sexual Activity  . Alcohol use: No  . Drug use: No  . Sexual activity: Yes    Birth control/protection: Surgical

## 2019-12-20 DIAGNOSIS — R609 Edema, unspecified: Secondary | ICD-10-CM | POA: Diagnosis not present

## 2019-12-20 DIAGNOSIS — E1165 Type 2 diabetes mellitus with hyperglycemia: Secondary | ICD-10-CM | POA: Diagnosis not present

## 2019-12-20 DIAGNOSIS — R5383 Other fatigue: Secondary | ICD-10-CM | POA: Diagnosis not present

## 2019-12-20 DIAGNOSIS — Z1389 Encounter for screening for other disorder: Secondary | ICD-10-CM | POA: Diagnosis not present

## 2019-12-20 DIAGNOSIS — E782 Mixed hyperlipidemia: Secondary | ICD-10-CM | POA: Diagnosis not present

## 2019-12-20 DIAGNOSIS — Z1331 Encounter for screening for depression: Secondary | ICD-10-CM | POA: Diagnosis not present

## 2020-01-06 ENCOUNTER — Encounter: Payer: Self-pay | Admitting: Orthopaedic Surgery

## 2020-01-06 ENCOUNTER — Ambulatory Visit (INDEPENDENT_AMBULATORY_CARE_PROVIDER_SITE_OTHER): Payer: BC Managed Care – PPO | Admitting: Orthopaedic Surgery

## 2020-01-06 ENCOUNTER — Other Ambulatory Visit: Payer: Self-pay

## 2020-01-06 VITALS — Ht 68.0 in | Wt 243.0 lb

## 2020-01-06 DIAGNOSIS — M65332 Trigger finger, left middle finger: Secondary | ICD-10-CM

## 2020-01-06 MED ORDER — METHYLPREDNISOLONE ACETATE 40 MG/ML IJ SUSP
13.3300 mg | INTRAMUSCULAR | Status: AC | PRN
  Administered 2020-01-06: 13.33 mg

## 2020-01-06 MED ORDER — BUPIVACAINE HCL 0.25 % IJ SOLN
0.3300 mL | INTRAMUSCULAR | Status: AC | PRN
  Administered 2020-01-06: .33 mL

## 2020-01-06 MED ORDER — LIDOCAINE HCL 1 % IJ SOLN
0.3000 mL | INTRAMUSCULAR | Status: AC | PRN
  Administered 2020-01-06: .3 mL

## 2020-01-06 NOTE — Progress Notes (Signed)
Office Visit Note   Patient: Sandra Terrell           Date of Birth: 1965-05-11           MRN: [id number] Visit Date: 01/06/2020              Requested by: Practice, [facility] [place],  [place] [address] PCP: [facility], [facility]   Assessment & Plan: Visit Diagnoses:  1. Trigger finger, left middle finger     Plan: Patient is having some flexor tenosynovitis.  We injected this area with 1/3+1/3+1/3 which she tolerated well.  Work slip given no work x3 weeks.  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  She will call us if she is having persistent problems and will check her sugars carefully and adjust her insulin up if she is having some hypoglycemia post injection.  Follow-Up Instructions: No follow-ups on file.   Orders:  Orders Placed This Encounter  Procedures  . Hand/UE Inj: L long A1   No orders of the defined types were placed in this encounter.     Procedures: Hand/UE Inj: L long A1 for trigger finger on 01/06/2020 11:43 AM Medications: 0.3 mL lidocaine 1 %; 0.33 mL bupivacaine 0.25 %; 13.33 mg methylPREDNISolone acetate 40 MG/ML      Clinical Data: No additional findings.   Subjective: Chief Complaint  Patient presents with  . Right Hand - Follow-up    11/15/2019 Right CTR  . Left Hand - Follow-up    11/15/2019 left long finger trigger finger release    HPI 54 year old type II diabetic on insulin post right carpal tunnel release and left trigger finger releases had trouble with her left hand proximal to the A1 pulley with continued pain when she uses her hand.  States she is not ready to resume work activities at this point and points to an area proximal to the A1 pulley where she has pain.  She has not had any continued active triggering.  Surgery date was 11/15/2019.  Review of Systems updated unchanged.   Objective: Vital Signs: Ht 5\' 8"  (1.727 m)   Wt  243 lb (110.2 kg)   BMI 36.95 kg/m   Physical Exam General exam unchanged.  Ortho Exam well-healed carpal tunnel incision left as well as trigger finger.  She has tenderness in the mid palm halfway between the carpal canal and the A1 pulley.  With flexion extension there appears to be a nodule on the flexor tendon but it is well proximal to the A1 pulley region which is been released and there is no triggering.  Sensation the fingertip is normal.  Specialty Comments:  No specialty comments available.  Imaging: No results found.   PMFS History: Patient Active Problem List   Diagnosis Date Noted  . Trigger finger, left middle finger 09/09/2019  . Carpal tunnel syndrome, right upper limb 08/05/2019  . Renal mass, right 08/25/2015  . Hyperlipidemia 04/26/2015  . Morbid obesity due to excess calories ([place]) 04/26/2015  . Essential hypertension, benign 03/23/2015  . Uncontrolled type 2 diabetes mellitus without complication, with long-term current use of insulin 03/23/2015   Past Medical History:  Diagnosis Date  . Arthritis   . Diabetes mellitus without complication ([place])   . Hypertension    not taking medications    Family History  Problem Relation Age of Onset  . Diabetes Mother   .  Hypertension Mother   . Cancer Father   . Diabetes Sister   . Hypertension Sister     Past Surgical History:  Procedure Laterality Date  . ABDOMINAL HYSTERECTOMY    . CARPAL TUNNEL RELEASE Left   . CARPAL TUNNEL RELEASE Right 11/15/2019   Procedure: RIGHT CARPAL TUNNEL RELEASE;  Surgeon: [name], MD;  Location: [facility];  Service: Orthopedics;  Laterality: Right;  . ROBOTIC ASSITED PARTIAL NEPHRECTOMY Right 08/25/2015   Procedure: XI ROBOTIC ASSITED RIGHT PARTIAL NEPHRECTOMY;  Surgeon: Cleon Gustin, MD;  Location: [facility];  Service: Urology;  Laterality: Right;  . TRIGGER FINGER RELEASE Left 11/15/2019   Procedure: LEFT LONG TRIGGER FINGER RELEASE;  Surgeon: [name], MD;  Location: [facility];  Service: Orthopedics;  Laterality: Left;   Social History   Occupational History  . Not on file  Tobacco Use  . Smoking status: Never Smoker  . Smokeless tobacco: Never Used  Substance and Sexual Activity  . Alcohol use: No  . Drug use: No  . Sexual activity: Yes    Birth control/protection: Surgical

## 2020-01-26 ENCOUNTER — Encounter: Payer: Self-pay | Admitting: Internal Medicine

## 2020-12-19 ENCOUNTER — Other Ambulatory Visit: Payer: Self-pay

## 2020-12-19 ENCOUNTER — Ambulatory Visit (INDEPENDENT_AMBULATORY_CARE_PROVIDER_SITE_OTHER): Payer: BC Managed Care – PPO | Admitting: Neurology

## 2020-12-19 ENCOUNTER — Encounter: Payer: Self-pay | Admitting: Neurology

## 2020-12-19 VITALS — BP 196/87 | HR 86 | Ht 68.0 in | Wt 252.5 lb

## 2020-12-19 DIAGNOSIS — M5416 Radiculopathy, lumbar region: Secondary | ICD-10-CM

## 2020-12-19 DIAGNOSIS — R202 Paresthesia of skin: Secondary | ICD-10-CM | POA: Diagnosis not present

## 2020-12-19 MED ORDER — DULOXETINE HCL 60 MG PO CPEP: 60.0000 mg | ORAL_CAPSULE | Freq: Every day | ORAL | 11 refills | Status: AC

## 2020-12-19 NOTE — Progress Notes (Signed)
Chief Complaint  Patient presents with   New Patient (Initial Visit)    Rm 15. Alone. NP/Paper proficient/ PCP is Clemmie Krill of [facility]. C/o Neuropathy d/t type 2 diabetes. Pt c/o of numbness on the top of right foot and big toe.       ASSESSMENT AND PLAN  Sandra Terrell is a 55 y.o. female   Right low back pain, radiating pain to right lower extremity Bilateral foot paresthesia  Poorly controlled diabetes, insulin-dependent, A1c was 11.2 in June 2022  Differentiation diagnosis include diabetic peripheral neuropathy with superimposed right lumbar radiculopathy  EMG nerve conduction study  MRI of lumbar spine  Cymbalta 60 mg daily   DIAGNOSTIC DATA (LABS, IMAGING, TESTING) - I reviewed patient records, labs, notes, testing and imaging myself where available. Laboratory evaluation in June 2022, normal CBC with hemoglobin of 12.0, CMP, glucose 121, creatinine of 0.79, normal TSH, LDL 146, A1c of 11.2,  MEDICAL HISTORY:  Sandra Terrell, is a 55 year old female, seen in request by her primary care PA   Rosalee Kaufman, from [facility] for evaluation of bilateral feet paresthesia, right lumbar radicular pain, initial evaluation was on December 19, 2020  I reviewed and summarized the referring note.  Past medical history Hyperlipidemia Diabetes since 2002, insulin dependent since 2018 History of bilateral Carpal tunnel syndrome release.  She has more than 20 years history of diabetes, insulin-dependent since 2018, missing her insulin shots sometimes, poorly controlled, A1c 11.2 in June 2022, since 2021, she noticed intermittent bilateral toes paresthesia, involving plantar surface and toes  Around 2020, she also developed worsening right-sided low back pain, radiating pain to right lateral thigh, calf, and right foot, worsening numbness on the right side, she denies gait abnormality, denies bowel and bladder incontinence  She works as a  Manufacturing systems engineer, standing up most of the time, had a history of bilateral hands paresthesia, still has intermittent fingertips paresthesia  PHYSICAL EXAM:   Vitals:   12/19/20 0923  BP: (!) 196/87  Pulse: 86  Weight: 252 lb 8 oz (114.5 kg)  Height: 5\' 8"  (1.727 m)   Not recorded     Body mass index is 38.39 kg/m.  PHYSICAL EXAMNIATION:  Gen: NAD, conversant, well nourised, well groomed                     Cardiovascular: Regular rate rhythm, no peripheral edema, warm, nontender. Eyes: Conjunctivae clear without exudates or hemorrhage Neck: Supple, no carotid bruits. Pulmonary: Clear to auscultation bilaterally   NEUROLOGICAL EXAM:  MENTAL STATUS: Speech:    Speech is normal; fluent and spontaneous with normal comprehension.  Cognition:     Orientation to time, place and person     Normal recent and remote memory     Normal Attention span and concentration     Normal Language, naming, repeating,spontaneous speech     Fund of knowledge   CRANIAL NERVES: CN II: Visual fields are full to confrontation. Pupils are round equal and briskly reactive to light. CN III, IV, VI: extraocular movement are normal. No ptosis. CN V: Facial sensation is intact to light touch CN VII: Face is symmetric with normal eye closure  CN VIII: Hearing is normal to causal conversation. CN IX, X: Phonation is normal. CN XI: Head turning and shoulder shrug are intact  MOTOR: There is no pronator drift of out-stretched arms. Muscle bulk and tone are normal. Muscle strength is normal.  REFLEXES: Reflexes are  2+ and symmetric at the biceps, triceps, knees, and absent at ankles. Plantar responses are flexor.  SENSORY: Length dependent decreased to light touch, pinprick, vibratory sensation to ankle level  COORDINATION: There is no trunk or limb dysmetria noted.  GAIT/STANCE: Posture is normal. Gait is steady with normal steps, base, arm swing, and turning. Heel and toe walking are normal.  Tandem gait is normal.  Romberg is absent.  REVIEW OF SYSTEMS:  Full 14 system review of systems performed and notable only for as above All other review of systems were negative.   ALLERGIES: Allergies  Allergen Reactions   Lisinopril Swelling    angioedema    HOME MEDICATIONS: Current Outpatient Medications  Medication Sig Dispense Refill   aspirin 81 MG tablet Take 81 mg by mouth daily.      atorvastatin (LIPITOR) 10 MG tablet Take 10 mg by mouth daily.     LANTUS 100 UNIT/ML injection      No current facility-administered medications for this visit.    PAST MEDICAL HISTORY: Past Medical History:  Diagnosis Date   Arthritis    Diabetes mellitus without complication ([place])    Hypertension    not taking medications    PAST SURGICAL HISTORY: Past Surgical History:  Procedure Laterality Date   ABDOMINAL HYSTERECTOMY     CARPAL TUNNEL RELEASE Left    CARPAL TUNNEL RELEASE Right 11/15/2019   Procedure: RIGHT CARPAL TUNNEL RELEASE;  Surgeon: [name], MD;  Location: [facility];  Service: Orthopedics;  Laterality: Right;   ROBOTIC ASSITED PARTIAL NEPHRECTOMY Right 08/25/2015   Procedure: XI ROBOTIC ASSITED RIGHT PARTIAL NEPHRECTOMY;  Surgeon: Cleon Gustin, MD;  Location: [facility];  Service: Urology;  Laterality: Right;   TRIGGER FINGER RELEASE Left 11/15/2019   Procedure: LEFT LONG TRIGGER FINGER RELEASE;  Surgeon: [name], MD;  Location: [facility];  Service: Orthopedics;  Laterality: Left;    FAMILY HISTORY: Family History  Problem Relation Age of Onset   Diabetes Mother    Hypertension Mother    Cancer Father    Diabetes Sister    Hypertension Sister     SOCIAL HISTORY: Social History   Socioeconomic History   Marital status: Single    Spouse name: Not on file   Number of children: Not on file   Years of education: Not on file   Highest education level: Not on file  Occupational History   Not on file  Tobacco  Use   Smoking status: Never   Smokeless tobacco: Never  Substance and Sexual Activity   Alcohol use: No   Drug use: No   Sexual activity: Yes    Birth control/protection: Surgical  Other Topics Concern   Not on file  Social History Narrative   Not on file   Social Determinants of Health   Financial Resource Strain: Not on file  Food Insecurity: Not on file  Transportation Needs: Not on file  Physical Activity: Not on file  Stress: Not on file  Social Connections: Not on file  Intimate Partner Violence: Not on file      Marcial Pacas, M.D. Ph.D.  [facility] [address], [place], [place] [address] Ph: [phone] Fax: [phone]  CC:  Rosalee Kaufman, PA-C [place],  [place] [address]  [facility]

## 2021-01-02 ENCOUNTER — Telehealth: Payer: Self-pay | Admitting: Neurology

## 2021-01-02 NOTE — Telephone Encounter (Signed)
01/02/21 x2 LVM for pt to call back EE  12/27/20 LVM to schedule 12/20/20 [employer] PA #[phone] (12/20/20- 01/18/21) TF

## 2021-01-09 NOTE — Telephone Encounter (Signed)
Patient is scheduled at [facility] for 01/24/21.

## 2021-01-24 ENCOUNTER — Ambulatory Visit: Payer: BC Managed Care – PPO

## 2021-01-24 DIAGNOSIS — M5416 Radiculopathy, lumbar region: Secondary | ICD-10-CM

## 2021-01-24 DIAGNOSIS — R202 Paresthesia of skin: Secondary | ICD-10-CM

## 2021-01-25 ENCOUNTER — Telehealth: Payer: Self-pay | Admitting: Neurology

## 2021-01-25 ENCOUNTER — Encounter: Payer: Self-pay | Admitting: *Deleted

## 2021-01-25 NOTE — Telephone Encounter (Signed)
IMPRESSION: This MRI of the lumbar spine without contrast shows the following: 1.   At T12-L1, there is a central disc protrusion and other degenerative change causing moderate spinal stenosis and moderate left lateral recess stenosis but no nerve root compression. 2.   At L3-L4, there are degenerative changes causing mild spinal stenosis and moderate bilateral lateral recess stenosis but no nerve root compression. 3.   At L4-L5, there are degenerative changes causing mild spinal stenosis and moderate left foraminal narrowing and moderately severe left greater than right lateral recess stenosis.  There is potential for left L5 nerve root compression, with moderate potential for right L5 nerve root compression. 4.   At L5-S1, there is minimal retrolisthesis and right paramedian disc protrusion and facet hypertrophy causing mild spinal stenosis and moderately severe right foraminal narrowing and right lateral recess stenosis with moderate narrowing on the left.  There is potential for right L5 and S1 nerve root compression.   Please call patient, MRI of lumbar showed multilevel degenerative changes, most obvious at T12, L1, with moderate spinal stenosis, L5-S1, with moderate severe right foraminal narrowing, potential right L5-S1 nerve root compression,  Encouraged her to keep January 25 appointment for EMG nerve conduction study

## 2021-01-25 NOTE — Telephone Encounter (Addendum)
Left message for a return call. Also, sent mychart message letting her know we need to speak with her.

## 2021-01-25 NOTE — Telephone Encounter (Signed)
I spoke to the patient. She is aware of MRI findings and agreeable to keep her pending NCV/EMG.

## 2021-02-21 ENCOUNTER — Other Ambulatory Visit: Payer: Self-pay

## 2021-02-21 ENCOUNTER — Ambulatory Visit (INDEPENDENT_AMBULATORY_CARE_PROVIDER_SITE_OTHER): Payer: BC Managed Care – PPO | Admitting: Neurology

## 2021-02-21 DIAGNOSIS — M5416 Radiculopathy, lumbar region: Secondary | ICD-10-CM | POA: Diagnosis not present

## 2021-02-21 DIAGNOSIS — R202 Paresthesia of skin: Secondary | ICD-10-CM

## 2021-02-21 DIAGNOSIS — R269 Unspecified abnormalities of gait and mobility: Secondary | ICD-10-CM

## 2021-02-21 DIAGNOSIS — G8929 Other chronic pain: Secondary | ICD-10-CM

## 2021-02-21 DIAGNOSIS — M5441 Lumbago with sciatica, right side: Secondary | ICD-10-CM | POA: Diagnosis not present

## 2021-02-21 NOTE — Procedures (Signed)
Full Name: Vermell Madrid Gender: Female MRN #: [id number] Date of Birth: [birth year]    Visit Date: 02/21/2021 07:12 Age: 56 Years Examining Physician: Marcial Pacas, MD  Referring Physician: Marcial Pacas, MD Height: 5 feet 8 inch Patient History: 53lbs History: 56 year old female, presented with worsening low back pain, bilateral feet paresthesia, poorly controlled diabetes  Summary of the test: Nerve conduction study: Left sural, superficial peroneal sensory responses were within normal limit.  Right sural, superficial sensory responses were absent, she has extensive lower extremity scar.  Left peroneal motor responses were normal.  Left tibial motor response showed moderately decreased CMAP amplitude, borderline conduction velocity. Right tibial, peroneal to EDB motor response showed significantly decreased CMAP amplitude, slow conduction velocity  Electromyography: Selected needle examination of bilateral lower extremity muscles, bilateral lumbosacral paraspinal muscles were performed.  There is evidence of chronic neuropathic changes involving bilateral lower extremity muscles, mainly bilateral L4-5 S1 myotomes.  There was no spontaneous activity at bilateral lumbosacral paraspinal muscles.  Conclusion: This is an abnormal study.  There is electrodiagnostic evidence of mild chronic bilateral lumbosacral radiculopathy, mainly involving bilateral L4-5 S1 myotomes.  There is no evidence of active process.  There is no evidence of large fiber peripheral neuropathy.    ------------------------------- Marcial Pacas M.D. PhD  [facility] [address], [place], [place] [address] Tel: [phone] Fax: [phone]  Verbal informed consent was obtained from the patient, patient was informed of potential risk of procedure, including bruising, bleeding, hematoma formation, infection, muscle weakness, muscle pain, numbness, among others.        [place]     Nerve / Sites Muscle Latency Ref. Amplitude Ref. Rel Amp Segments Distance Velocity Ref. Area    ms ms mV mV %  cm m/s m/s mVms  L Peroneal - EDB     Ankle EDB 4.2 ?6.5 2.1 ?2.0 100 Ankle - EDB 9   8.5     Fib head EDB 11.1  1.4  69.1 Fib head - Ankle 30 44 ?44 7.4     Pop fossa EDB 13.4  1.4  99.5 Pop fossa - Fib head 10 44 ?44 7.6         Pop fossa - Ankle      R Peroneal - EDB     Ankle EDB 4.1 ?6.5 1.5 ?2.0 100 Ankle - EDB 9   6.6     Fib head EDB 12.6  1.4  87.9 Fib head - Ankle 30 35 ?44 5.1     Pop fossa EDB 15.7  1.4  101 Pop fossa - Fib head 10 33 ?44 5.4         Pop fossa - Ankle      L Tibial - AH     Ankle AH 4.7 ?5.8 1.3 ?4.0 100 Ankle - AH 9   4.7     Pop fossa AH 14.6  0.6  44.8 Pop fossa - Ankle 42 42 ?41 3.0  R Tibial - AH     Ankle AH 4.3 ?5.8 0.7 ?4.0 100 Ankle - AH 9   2.8     Pop fossa AH 15.9  0.2  29.6 Pop fossa - Ankle 41 35 ?41 0.9             SNC    Nerve / Sites Rec. Site Peak Lat Ref.  Amp Ref. Segments Distance    ms ms V V  cm  L Sural - Ankle (  Calf)     Calf Ankle 2.5 ?4.4 6 ?6 Calf - Ankle 14  R Sural - Ankle (Calf)     Calf Ankle NR ?4.4 NR ?6 Calf - Ankle 14  L Superficial peroneal - Ankle     Lat leg Ankle 4.0 ?4.4 6 ?6 Lat leg - Ankle 14  R Superficial peroneal - Ankle     Lat leg Ankle NR ?4.4 NR ?6 Lat leg - Ankle 14             F  Wave    Nerve F Lat Ref.   ms ms  L Tibial - AH 77.3 ?56.0  R Tibial - AH NR ?56.0         EMG Summary Table    Spontaneous MUAP Recruitment  Muscle IA Fib PSW Fasc Other Amp Dur. Poly Pattern  R. Tibialis anterior Increased None None None _______ Normal Normal Normal Reduced  R. Tibialis posterior Normal None None None _______ Normal Normal Normal Reduced  R. Peroneus longus Normal None None None _______ Normal Normal Normal Normal  R. Gastrocnemius (Medial head) Increased None None None _______ Normal Normal Normal Reduced  R. Vastus lateralis Normal None None None _______ Normal Normal Normal Normal   L. Tibialis anterior Increased None None None _______ Normal Normal Normal Reduced  L. Tibialis posterior Normal None None None _______ Normal Normal Normal Reduced  L. Peroneus longus Normal None None None _______ Normal Normal Normal Reduced  L. Vastus lateralis Normal None None None _______ Normal Normal Normal Normal  R. Lumbar paraspinals (mid) Normal None None None _______ Normal Normal Normal Normal  R. Lumbar paraspinals (low) Normal None None None _______ Normal Normal Normal Normal  L. Lumbar paraspinals (low) Normal None None None _______ Normal Normal Normal Normal  L. Lumbar paraspinals (mid) Normal None None None _______ Normal Normal Normal Normal

## 2021-02-21 NOTE — Progress Notes (Addendum)
No chief complaint on file.     ASSESSMENT AND PLAN  Sandra Terrell is a 56 y.o. female   Right low back pain, radiating pain to right lower extremity Bilateral foot paresthesia  Poorly controlled diabetes, insulin-dependent, A1c was 11.2 in June 2022  No evidence of large fiber peripheral neuropathy on EMG nerve conduction study February 21, 2021,  evidence of mild chronic bilateral lumbosacral radiculopathy,  MRI of lumbar spine did show multilevel degenerative changes, variable degree of foraminal narrowing, moderate stenosis at T12-L1 level, there was no evidence of significant canal stenosis, less likely to be a surgical candidate at this point.  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 Laboratory evaluation in June 2022, normal CBC with hemoglobin of 12.0, CMP, glucose 121, creatinine of 0.79, normal TSH, LDL 146, A1c of 11.2,  MEDICAL HISTORY:  Sandra Terrell, is a 56 year old female, seen in request by her primary care PA   Rosalee Kaufman, from [facility] for evaluation of bilateral feet paresthesia, right lumbar radicular pain, initial evaluation was on December 19, 2020  I reviewed and summarized the referring note.  Past medical history Hyperlipidemia Diabetes since 2002, insulin dependent since 2018 History of bilateral Carpal tunnel syndrome release.  She has more than 20 years history of diabetes, insulin-dependent since 2018, missing her insulin shots sometimes, poorly controlled, A1c 11.2 in June 2022, since 2021, she noticed intermittent bilateral toes paresthesia, involving plantar surface and  toes  Around 2020, she also developed worsening right-sided low back pain, radiating pain to right lateral thigh, calf, and right foot, worsening numbness on the right side, she denies gait abnormality, denies bowel and bladder incontinence  She works as a Manufacturing systems engineer, standing up most of the time, had a history of bilateral hands paresthesia, still has intermittent fingertips paresthesia  Update February 21, 2021: She continue complains of significant low back pain, occasionally radiating pain to bilateral hip, persistent bilateral foot numbness below ankle level  EMG nerve conduction study February 21, 2021 showed no evidence of large fiber peripheral neuropathy, the absent right lower extremity sensory response due to her right lower extremity skin scar, there is evidence of chronic bilateral lumbosacral radiculopathy.  There is no evidence of active process.  Reviewed laboratory evaluations, A1c in August was 10.8, elevated ESR 49,  Also personally reviewed MRI lumbar spine in December 2022, multilevel degenerative changes, moderate spinal stenosis T12-L1, lateral recess stenosis, no nerve root compression  There was no significant lower level spinal canal stenosis, variable degree of foraminal stenosis   PHYSICAL EXAM:    PHYSICAL EXAMNIATION:  Gen: NAD, conversant, well nourised, well groomed         NEUROLOGICAL EXAM:  MENTAL STATUS: Speech/cognition: Awake,: Alert, oriented to history taking and casual conversation  CRANIAL NERVES: CN II: Visual fields are full to confrontation. Pupils are round equal and briskly reactive to light. CN III, IV, VI: extraocular movement are normal. No ptosis. CN V: Facial sensation is intact to light touch CN VII: Face is symmetric with normal eye closure  CN VIII: Hearing is normal to causal conversation. CN IX, X: Phonation is normal. CN XI: Head turning and shoulder shrug are intact  MOTOR: There is no pronator drift of  out-stretched arms. Muscle bulk and tone are normal. Muscle strength is normal.  Significant skin discoloration, multiple scars at bilateral shin  REFLEXES: Reflexes are 2+ and symmetric at the biceps, triceps, knees, and absent at ankles. Plantar responses are flexor.  SENSORY: Length dependent decreased to light touch, pinprick, vibratory sensation to ankle level  COORDINATION: There is no trunk or limb dysmetria noted.  GAIT/STANCE: Need push-up to get up from seated position, mildly antalgic,.  REVIEW OF SYSTEMS:  Full 14 system review of systems performed and notable only for as above All other review of systems were negative.   ALLERGIES: Allergies  Allergen Reactions   Lisinopril Swelling    angioedema    HOME MEDICATIONS: Current Outpatient Medications  Medication Sig Dispense Refill   aspirin 81 MG tablet Take 81 mg by mouth daily.      atorvastatin (LIPITOR) 10 MG tablet Take 10 mg by mouth daily.     DULoxetine (CYMBALTA) 60 MG capsule Take 1 capsule (60 mg total) by mouth daily. 30 capsule 11   LANTUS 100 UNIT/ML injection      No current facility-administered medications for this visit.    PAST MEDICAL HISTORY: Past Medical History:  Diagnosis Date   Arthritis    Diabetes mellitus without complication ([place])    Hypertension    not taking medications    PAST SURGICAL HISTORY: Past Surgical History:  Procedure Laterality Date   ABDOMINAL HYSTERECTOMY     CARPAL TUNNEL RELEASE Left    CARPAL TUNNEL RELEASE Right 11/15/2019   Procedure: RIGHT CARPAL TUNNEL RELEASE;  Surgeon: [name], MD;  Location: [facility];  Service: Orthopedics;  Laterality: Right;   ROBOTIC ASSITED PARTIAL NEPHRECTOMY Right 08/25/2015   Procedure: XI ROBOTIC ASSITED RIGHT PARTIAL NEPHRECTOMY;  Surgeon: Cleon Gustin, MD;  Location: [facility];  Service: Urology;  Laterality: Right;   TRIGGER FINGER RELEASE Left 11/15/2019   Procedure: LEFT LONG TRIGGER FINGER  RELEASE;  Surgeon: [name], MD;  Location: [facility];  Service: Orthopedics;  Laterality: Left;    FAMILY HISTORY: Family History  Problem Relation Age of Onset   Diabetes Mother    Hypertension Mother    Cancer Father    Diabetes Sister    Hypertension Sister     SOCIAL HISTORY: Social History   Socioeconomic History   Marital status: Single    Spouse name: Not on file   Number of children: Not on file   Years of education: Not on file   Highest education level: Not on file  Occupational History   Not on file  Tobacco Use   Smoking status: Never   Smokeless tobacco: Never  Substance and Sexual Activity   Alcohol use: No   Drug use: No   Sexual activity: Yes    Birth control/protection: Surgical  Other Topics Concern   Not on file  Social History Narrative   Not on file   Social Determinants of Health   Financial Resource Strain: Not on file  Food Insecurity: Not on file  Transportation Needs: Not on file  Physical Activity: Not on file  Stress: Not on file  Social Connections: Not on file  Intimate Partner Violence: Not on file      Marcial Pacas, M.D. Ph.D.  [facility] [address], [place], [place] [address] Ph: [phone] Fax: [phone]  CC:  Practice, [facility] [place]  [place],  [place] [address]  Practice, [facility]

## 2021-02-22 ENCOUNTER — Telehealth: Payer: Self-pay | Admitting: Neurology

## 2021-02-22 MED ORDER — VITAMIN D (ERGOCALCIFEROL) 1.25 MG (50000 UNIT) PO CAPS: 50000.0000 [IU] | ORAL_CAPSULE | ORAL | 0 refills | Status: DC

## 2021-02-22 NOTE — Telephone Encounter (Signed)
Still unable to reach patient. I spoke to her sister on [place]. She verbalized understanding of the findings. She will relay the message and make sure she follows up with her PCP. Also, she will have her start the recommended vitamin D. Provided our number to call back with any questions.

## 2021-02-22 NOTE — Telephone Encounter (Signed)
Please call patient, A1c was significantly elevated at 12, I have forwarded the lab result to her primary care Practice, [facility] She should contact them for better management of her diabetes,  Vitamin D deficiency 10.3, I ERX vitamin D 50,000 units weekly for 5-week, then she should start over-the-counter D3 supplement 1000 units 2 tablets daily.

## 2021-02-22 NOTE — Telephone Encounter (Signed)
Attempted to call pt, LVM for call back  °

## 2021-02-27 LAB — RPR: RPR Ser Ql: NONREACTIVE

## 2021-02-27 LAB — VITAMIN B12: Vitamin B-12: 722 pg/mL (ref 232–1245)

## 2021-02-27 LAB — MULTIPLE MYELOMA PANEL, SERUM
Albumin SerPl Elph-Mcnc: 3.4 g/dL (ref 2.9–4.4)
Albumin/Glob SerPl: 1.1 (ref 0.7–1.7)
Alpha 1: 0.2 g/dL (ref 0.0–0.4)
Alpha2 Glob SerPl Elph-Mcnc: 0.7 g/dL (ref 0.4–1.0)
B-Globulin SerPl Elph-Mcnc: 1.1 g/dL (ref 0.7–1.3)
Gamma Glob SerPl Elph-Mcnc: 1.3 g/dL (ref 0.4–1.8)
Globulin, Total: 3.3 g/dL (ref 2.2–3.9)
IgA/Immunoglobulin A, Serum: 254 mg/dL (ref 87–352)
IgG (Immunoglobin G), Serum: 1330 mg/dL (ref 586–1602)
IgM (Immunoglobulin M), Srm: 56 mg/dL (ref 26–217)
Total Protein: 6.7 g/dL (ref 6.0–8.5)

## 2021-02-27 LAB — VITAMIN D 25 HYDROXY (VIT D DEFICIENCY, FRACTURES): Vit D, 25-Hydroxy: 10.3 ng/mL — ABNORMAL LOW (ref 30.0–100.0)

## 2021-02-27 LAB — SEDIMENTATION RATE: Sed Rate: 13 mm/hr (ref 0–40)

## 2021-02-27 LAB — HGB A1C W/O EAG: Hgb A1c MFr Bld: 12 % — ABNORMAL HIGH (ref 4.8–5.6)

## 2021-02-27 LAB — COPPER, SERUM: Copper: 144 ug/dL (ref 80–158)

## 2021-02-27 LAB — C-REACTIVE PROTEIN: CRP: 1 mg/L (ref 0–10)

## 2021-02-27 LAB — FERRITIN: Ferritin: 119 ng/mL (ref 15–150)

## 2021-02-27 LAB — ANA W/REFLEX IF POSITIVE: Anti Nuclear Antibody (ANA): NEGATIVE

## 2021-02-27 LAB — TSH: TSH: 1.45 u[IU]/mL (ref 0.450–4.500)

## 2021-03-19 ENCOUNTER — Other Ambulatory Visit: Payer: Self-pay

## 2021-03-19 ENCOUNTER — Ambulatory Visit (HOSPITAL_COMMUNITY): Payer: BC Managed Care – PPO | Attending: Neurology | Admitting: Physical Therapy

## 2021-03-19 ENCOUNTER — Encounter (HOSPITAL_COMMUNITY): Payer: Self-pay | Admitting: Physical Therapy

## 2021-03-19 DIAGNOSIS — M79661 Pain in right lower leg: Secondary | ICD-10-CM | POA: Diagnosis not present

## 2021-03-19 DIAGNOSIS — M5416 Radiculopathy, lumbar region: Secondary | ICD-10-CM | POA: Diagnosis not present

## 2021-03-19 DIAGNOSIS — G8929 Other chronic pain: Secondary | ICD-10-CM | POA: Insufficient documentation

## 2021-03-19 DIAGNOSIS — M5441 Lumbago with sciatica, right side: Secondary | ICD-10-CM | POA: Insufficient documentation

## 2021-03-19 DIAGNOSIS — R202 Paresthesia of skin: Secondary | ICD-10-CM | POA: Diagnosis not present

## 2021-03-19 NOTE — Patient Instructions (Signed)
Access Code: [email or web address] URL: [email or web address] Date: 03/19/2021 Prepared by: Josue Hector  Exercises Supine Transversus Abdominis Bracing - Hands on Ground - 2-3 x daily - 7 x weekly - 1-2 sets - 10 reps - 5 second hold Supine Bridge - 2-3 x daily - 7 x weekly - 1-2 sets - 10 reps - 5 second hold Static Prone on Elbows - 2-3 x daily - 7 x weekly - 1 sets - 1 reps - 3-5 minute hold

## 2021-03-19 NOTE — Therapy (Signed)
[facility] [place], [place], [address] Phone: [phone]   Fax:  [phone]  Physical Therapy Evaluation  Patient Details  Name: Sandra Terrell MRN: [id number] Date of Birth: May 13, 1965 Referring Provider (PT): Marcial Pacas MD   Encounter Date: 03/19/2021   PT End of Session - 03/19/21 1517     Visit Number 1    Number of Visits 8    Date for PT Re-Evaluation 04/16/21    Authorization Type BCBS COMM PPO (30 VL)    Authorization - Visit Number 1    Authorization - Number of Visits 30    PT Start Time 1440    PT Stop Time 1520    PT Time Calculation (min) 40 min    Activity Tolerance Patient tolerated treatment well    Behavior During Therapy WFL for tasks assessed/performed             Past Medical History:  Diagnosis Date   Arthritis    Diabetes mellitus without complication ([place])    Hypertension    not taking medications    Past Surgical History:  Procedure Laterality Date   ABDOMINAL HYSTERECTOMY     CARPAL TUNNEL RELEASE Left    CARPAL TUNNEL RELEASE Right 11/15/2019   Procedure: RIGHT CARPAL TUNNEL RELEASE;  Surgeon: [name], MD;  Location: [facility];  Service: Orthopedics;  Laterality: Right;   ROBOTIC ASSITED PARTIAL NEPHRECTOMY Right 08/25/2015   Procedure: XI ROBOTIC ASSITED RIGHT PARTIAL NEPHRECTOMY;  Surgeon: Cleon Gustin, MD;  Location: [facility];  Service: Urology;  Laterality: Right;   TRIGGER FINGER RELEASE Left 11/15/2019   Procedure: LEFT LONG TRIGGER FINGER RELEASE;  Surgeon: [name], MD;  Location: [facility];  Service: Orthopedics;  Laterality: Left;    There were no vitals filed for this visit.    Subjective Assessment - 03/19/21 1447     Subjective Patient presents to therapy with complaint of RT side leg pain. This began insidiously about 1 year ago. She has been getting shots in her foot for heel spurs. She is currently managing pain  with medication.    Limitations Standing;Walking;House hold activities;Sitting    How long can you stand comfortably? 1 hour    How long can you walk comfortably? 1 hour    Patient Stated Goals Get back to what I use to (standing, walking)    Currently in Pain? Yes    Pain Score 2     Pain Location Leg    Pain Orientation Right;Anterior    Pain Descriptors / Indicators Aching;Dull    Pain Type Chronic pain    Pain Onset More than a month ago    Pain Frequency Constant    Aggravating Factors  standing, walking    Pain Relieving Factors meds, heat    Effect of Pain on Daily Activities Limits                [facility] PT Assessment - 03/19/21 0001       Assessment   Medical Diagnosis RT leg pain    Referring Provider (PT) Marcial Pacas MD    Next MD Visit none scheduled    Prior Therapy Yes      Precautions   Precautions None      Restrictions   Weight Bearing Restrictions No      Balance Screen   Has the patient fallen in the past 6 months No  Prior Function   Level of Independence Independent    Vocation Full time employment    Energy manager      Cognition   Overall Cognitive Status Within Functional Limits for tasks assessed      Observation/Other Assessments   Focus on Therapeutic Outcomes (FOTO)  Complete next session      Posture/Postural Control   Posture/Postural Control Postural limitations    Postural Limitations Anterior pelvic tilt      ROM / Strength   AROM / PROM / Strength AROM;Strength      AROM   AROM Assessment Site Lumbar    Lumbar Flexion WFL    Lumbar Extension 90% limited   increased low back pain   Lumbar - Right Side Bend WFL    Lumbar - Left Side Bend WFL    Lumbar - Right Rotation 20% limited    Lumbar - Left Rotation 20% limited      Strength   Strength Assessment Site Hip;Knee;Ankle    Right/Left Hip Right;Left    Right Hip Flexion 4/5    Right Hip Extension 3-/5    Right Hip ABduction 3+/5    Left Hip  Flexion 5/5    Left Hip Extension 4/5    Left Hip ABduction 4/5    Right/Left Knee Right;Left    Right Knee Extension 5/5    Left Knee Extension 5/5    Right/Left Ankle Right;Left    Right Ankle Dorsiflexion 5/5    Left Ankle Dorsiflexion 5/5      Palpation   Palpation comment Mod TTP about RT torachanter, ITB and lumbar paraspinals about L3-5                        Objective measurements completed on examination: See above findings.       [facility] Adult PT Treatment/Exercise - 03/19/21 0001       Exercises   Exercises Lumbar      Lumbar Exercises: Stretches   Prone on Elbows Stretch 1 rep;60 seconds      Lumbar Exercises: Supine   Ab Set 5 reps    Bridge 5 reps                     PT Education - 03/19/21 1450     Education Details on evaluation findings, POC and HEP    Person(s) Educated Patient    Methods Explanation;Handout    Comprehension Verbalized understanding              PT Short Term Goals - 03/19/21 1529       PT SHORT TERM GOAL #1   Title Patient will be independent with initial HEP and self-management strategies to improve functional outcomes    Time 2    Period Weeks    Status New    Target Date 04/02/21               PT Long Term Goals - 03/19/21 1529       PT LONG TERM GOAL #1   Title Patient will be independent with advanced HEP and self-management strategies to improve functional outcomes    Time 4    Period Weeks    Status New    Target Date 04/16/21      PT LONG TERM GOAL #2   Title Patient will improve FOTO score to predicted value to indicate improvement in functional outcomes    Time 4  Period Weeks    Status New    Target Date 04/16/21      PT LONG TERM GOAL #3   Title Patient will have equal to or > 4/5 MMT throughout RLE to improve ability to perform functional mobility, stair ambulation and ADLs.    Time 4    Period Weeks    Status New    Target Date 04/16/21      PT LONG TERM  GOAL #4   Title Patient will report at least 75% overall improvement in subjective complaint to indicate improvement in ability to perform ADLs.    Time 4    Period Weeks    Status New    Target Date 04/16/21      PT LONG TERM GOAL #5   Title Patient will improve lumbar extension AROM by at least 25% for improved ability to perform functional mobility tasks and ADLs.    Time 4    Period Weeks    Status New    Target Date 04/16/21                    Plan - 03/19/21 1521     Clinical Impression Statement Patient is a 56 y.o. female who presents to physical therapy with complaint of RT leg pain. Patient demonstrates decreased strength, ROM restriction, and postural abnormalities which are likely contributing to symptoms of pain and are negatively impacting patient ability to perform ADLs and functional mobility tasks. Patient will benefit from skilled physical therapy services to address these deficits to reduce pain, improve level of function with ADLs, functional mobility tasks.    Examination-Activity Limitations Lift;Locomotion Level;Transfers;Stand;Stairs;Squat;Sit    Examination-Participation Restrictions Occupation;Laundry;Yard Work;Community Activity;Shop;Cleaning    Stability/Clinical Decision Making Stable/Uncomplicated    Clinical Decision Making Low    Rehab Potential Good    PT Frequency 2x / week    PT Duration 4 weeks    PT Treatment/Interventions ADLs/Self Care Home Management;Aquatic Therapy;Biofeedback;Cryotherapy;Electrical Stimulation;Iontophoresis 4mg /ml Dexamethasone;Moist Heat;Traction;Balance training;Therapeutic exercise;Vasopneumatic Device;Manual techniques;Therapeutic activities;DME Instruction;Contrast Bath;Fluidtherapy;Parrafin;Ultrasound;Neuromuscular re-education;Patient/family education;Gait training;Stair training;Functional mobility training;Orthotic Fit/Training;Compression bandaging;Scar mobilization;Visual/perceptual  remediation/compensation;Passive range of motion;Dry needling;Spinal Manipulations;Joint Manipulations;Energy conservation;Splinting;Taping;Manual lymph drainage    PT Next Visit Plan Complete FOTO. Progress hip and core strength as tolerated. F/u on repsonse to HEP. STM for pain and restriction in lumbar and RT hip    PT Home Exercise Plan Eval: ab brace, bridge, POE    Consulted and Agree with Plan of Care Patient             Patient will benefit from skilled therapeutic intervention in order to improve the following deficits and impairments:  Decreased activity tolerance, Decreased strength, Increased fascial restricitons, Pain, Decreased mobility, Decreased range of motion, Improper body mechanics, Postural dysfunction  Visit Diagnosis: Pain in right lower leg     Problem List Patient Active Problem List   Diagnosis Date Noted   Chronic right-sided low back pain with right-sided sciatica 02/21/2021   Gait abnormality 02/21/2021   Lumbar radiculopathy 02/21/2021   Right lumbar radiculopathy 12/19/2020   Paresthesia 12/19/2020   Trigger finger, left middle finger 09/09/2019   Carpal tunnel syndrome, right upper limb 08/05/2019   Renal mass, right 08/25/2015   Hyperlipidemia 04/26/2015   Morbid obesity due to excess calories ([place]) 04/26/2015   Essential hypertension, benign 03/23/2015   Uncontrolled type 2 diabetes mellitus without complication, with long-term current use of insulin 03/23/2015   3:35 PM, 03/19/21 Josue Hector PT DPT  Physical Therapist with [facility]  [facility]  [phone]  [facility] [address] [place], [place], [address] Phone: [phone]   Fax:  [phone]  Name: Sandra Terrell MRN: [id number] Date of Birth: [birth year]

## 2021-03-27 ENCOUNTER — Ambulatory Visit (HOSPITAL_COMMUNITY): Payer: BC Managed Care – PPO | Admitting: Physical Therapy

## 2021-03-28 ENCOUNTER — Ambulatory Visit (HOSPITAL_COMMUNITY): Payer: BC Managed Care – PPO | Attending: Neurology | Admitting: Physical Therapy

## 2021-03-28 ENCOUNTER — Other Ambulatory Visit: Payer: Self-pay

## 2021-03-28 ENCOUNTER — Telehealth (HOSPITAL_COMMUNITY): Payer: Self-pay | Admitting: Physical Therapy

## 2021-03-28 NOTE — Telephone Encounter (Signed)
Pt did not show for appt.  Called and left message regarding missed appt and reminder for next on on Monday at 4pm.   ? ?Teena Irani, PTA/CLT, WTA ?[phone] ? ?

## 2021-04-02 ENCOUNTER — Ambulatory Visit (HOSPITAL_COMMUNITY): Payer: BC Managed Care – PPO

## 2021-04-05 ENCOUNTER — Ambulatory Visit (HOSPITAL_COMMUNITY): Payer: BC Managed Care – PPO | Admitting: Physical Therapy

## 2021-04-11 ENCOUNTER — Telehealth (HOSPITAL_COMMUNITY): Payer: Self-pay | Admitting: Physical Therapy

## 2021-04-11 ENCOUNTER — Encounter (HOSPITAL_COMMUNITY): Payer: BC Managed Care – PPO | Admitting: Physical Therapy

## 2021-04-11 NOTE — Telephone Encounter (Signed)
Pt did not show for appt.  Today is 2nd consecutive NS.  Message left on VM regarding NS policy and informed of next scheduled appt on Monday 3/20.  All additional appointments cancelled at this time ? ?Emmalena Canny S[place] Soda, PTA/CLT, WTA ?[phone] ? ?

## 2021-04-16 ENCOUNTER — Encounter (HOSPITAL_COMMUNITY): Payer: BC Managed Care – PPO | Admitting: Physical Therapy

## 2021-04-18 ENCOUNTER — Encounter (HOSPITAL_COMMUNITY): Payer: BC Managed Care – PPO | Admitting: Physical Therapy

## 2021-04-19 ENCOUNTER — Encounter (HOSPITAL_COMMUNITY): Payer: Self-pay | Admitting: Physical Therapy

## 2021-04-19 NOTE — Therapy (Signed)
[facility] [place] [facility] ?[address] ?[place], [place], [address] ?Phone: [phone]   Fax:  [phone] ? ?Patient Details  ?Name: Sandra Terrell ?MRN: [id number] ?Date of Birth: [birth year] ?Referring Provider:  No ref. provider found ? ?Encounter Date: 04/19/2021 ?PHYSICAL THERAPY DISCHARGE SUMMARY ? ?Visits from Start of Care: 1 ? ?Current functional level related to goals / functional outcomes: ?NA ?  ?Remaining deficits: ?NA ?  ?Education / Equipment: ?Patient DC for non return   ? ?Patient agrees to discharge. Patient goals were not met. Patient is being discharged due to not returning since the last visit. ? ?3:42 PM, 04/19/21 ?Josue Hector PT DPT  ?Physical Therapist with [facility]  ?[facility]  ?[phone] ? ?[place] ?[place] ?[address] ?[place], [place], [address] ?Phone: [phone]   Fax:  [phone] ?

## 2021-04-24 ENCOUNTER — Encounter (HOSPITAL_COMMUNITY): Payer: BC Managed Care – PPO | Admitting: Physical Therapy

## 2021-04-26 ENCOUNTER — Encounter (HOSPITAL_COMMUNITY): Payer: BC Managed Care – PPO | Admitting: Physical Therapy

## 2021-06-13 DIAGNOSIS — Z91199 Patient's noncompliance with other medical treatment and regimen due to unspecified reason: Secondary | ICD-10-CM | POA: Diagnosis not present

## 2021-06-13 DIAGNOSIS — Z6837 Body mass index (BMI) 37.0-37.9, adult: Secondary | ICD-10-CM | POA: Diagnosis not present

## 2021-06-13 DIAGNOSIS — I10 Essential (primary) hypertension: Secondary | ICD-10-CM | POA: Diagnosis not present

## 2021-06-13 DIAGNOSIS — E1140 Type 2 diabetes mellitus with diabetic neuropathy, unspecified: Secondary | ICD-10-CM | POA: Diagnosis not present

## 2021-06-13 DIAGNOSIS — E1165 Type 2 diabetes mellitus with hyperglycemia: Secondary | ICD-10-CM | POA: Diagnosis not present

## 2021-08-20 DIAGNOSIS — Z6836 Body mass index (BMI) 36.0-36.9, adult: Secondary | ICD-10-CM | POA: Diagnosis not present

## 2021-08-20 DIAGNOSIS — R109 Unspecified abdominal pain: Secondary | ICD-10-CM | POA: Diagnosis not present

## 2021-08-20 DIAGNOSIS — N3000 Acute cystitis without hematuria: Secondary | ICD-10-CM | POA: Diagnosis not present

## 2021-09-12 ENCOUNTER — Other Ambulatory Visit: Payer: Self-pay | Admitting: Physician Assistant

## 2021-09-12 DIAGNOSIS — R921 Mammographic calcification found on diagnostic imaging of breast: Secondary | ICD-10-CM

## 2021-09-26 ENCOUNTER — Other Ambulatory Visit: Payer: BC Managed Care – PPO

## 2021-09-28 ENCOUNTER — Ambulatory Visit
Admission: RE | Admit: 2021-09-28 | Discharge: 2021-09-28 | Disposition: A | Discharge status: Home / Self Care | Payer: BC Managed Care – PPO | Source: Ambulatory Visit | Attending: Physician Assistant | Admitting: Physician Assistant

## 2021-09-28 DIAGNOSIS — R921 Mammographic calcification found on diagnostic imaging of breast: Secondary | ICD-10-CM

## 2021-09-28 DIAGNOSIS — N6021 Fibroadenosis of right breast: Secondary | ICD-10-CM | POA: Diagnosis not present

## 2021-11-28 DIAGNOSIS — M79676 Pain in unspecified toe(s): Secondary | ICD-10-CM | POA: Diagnosis not present

## 2021-11-28 DIAGNOSIS — Z6837 Body mass index (BMI) 37.0-37.9, adult: Secondary | ICD-10-CM | POA: Diagnosis not present

## 2021-11-28 DIAGNOSIS — M25579 Pain in unspecified ankle and joints of unspecified foot: Secondary | ICD-10-CM | POA: Diagnosis not present

## 2021-11-29 DIAGNOSIS — M659 Synovitis and tenosynovitis, unspecified: Secondary | ICD-10-CM | POA: Diagnosis not present

## 2021-11-29 DIAGNOSIS — R9389 Abnormal findings on diagnostic imaging of other specified body structures: Secondary | ICD-10-CM | POA: Diagnosis not present

## 2021-11-29 DIAGNOSIS — M7989 Other specified soft tissue disorders: Secondary | ICD-10-CM | POA: Diagnosis not present

## 2021-11-29 DIAGNOSIS — M65871 Other synovitis and tenosynovitis, right ankle and foot: Secondary | ICD-10-CM | POA: Diagnosis not present

## 2021-11-29 DIAGNOSIS — S93401A Sprain of unspecified ligament of right ankle, initial encounter: Secondary | ICD-10-CM | POA: Diagnosis not present

## 2021-11-30 DIAGNOSIS — Z6837 Body mass index (BMI) 37.0-37.9, adult: Secondary | ICD-10-CM | POA: Diagnosis not present

## 2021-11-30 DIAGNOSIS — M25579 Pain in unspecified ankle and joints of unspecified foot: Secondary | ICD-10-CM | POA: Diagnosis not present

## 2021-11-30 DIAGNOSIS — M79676 Pain in unspecified toe(s): Secondary | ICD-10-CM | POA: Diagnosis not present

## 2021-12-05 DIAGNOSIS — M7751 Other enthesopathy of right foot: Secondary | ICD-10-CM | POA: Diagnosis not present

## 2021-12-05 DIAGNOSIS — M79671 Pain in right foot: Secondary | ICD-10-CM | POA: Diagnosis not present

## 2021-12-26 DIAGNOSIS — M79672 Pain in left foot: Secondary | ICD-10-CM | POA: Diagnosis not present

## 2021-12-26 DIAGNOSIS — M722 Plantar fascial fibromatosis: Secondary | ICD-10-CM | POA: Diagnosis not present

## 2021-12-26 DIAGNOSIS — M79671 Pain in right foot: Secondary | ICD-10-CM | POA: Diagnosis not present

## 2021-12-26 DIAGNOSIS — E1140 Type 2 diabetes mellitus with diabetic neuropathy, unspecified: Secondary | ICD-10-CM | POA: Diagnosis not present

## 2022-01-16 DIAGNOSIS — M722 Plantar fascial fibromatosis: Secondary | ICD-10-CM | POA: Diagnosis not present

## 2022-01-16 DIAGNOSIS — M79671 Pain in right foot: Secondary | ICD-10-CM | POA: Diagnosis not present

## 2022-03-10 DIAGNOSIS — Z888 Allergy status to other drugs, medicaments and biological substances status: Secondary | ICD-10-CM | POA: Diagnosis not present

## 2022-03-10 DIAGNOSIS — J029 Acute pharyngitis, unspecified: Secondary | ICD-10-CM | POA: Diagnosis not present

## 2022-03-10 DIAGNOSIS — R059 Cough, unspecified: Secondary | ICD-10-CM | POA: Diagnosis not present

## 2022-03-10 DIAGNOSIS — Z79899 Other long term (current) drug therapy: Secondary | ICD-10-CM | POA: Diagnosis not present

## 2022-03-10 DIAGNOSIS — R5383 Other fatigue: Secondary | ICD-10-CM | POA: Diagnosis not present

## 2022-03-10 DIAGNOSIS — M1990 Unspecified osteoarthritis, unspecified site: Secondary | ICD-10-CM | POA: Diagnosis not present

## 2022-03-10 DIAGNOSIS — Z7984 Long term (current) use of oral hypoglycemic drugs: Secondary | ICD-10-CM | POA: Diagnosis not present

## 2022-03-10 DIAGNOSIS — Z85528 Personal history of other malignant neoplasm of kidney: Secondary | ICD-10-CM | POA: Diagnosis not present

## 2022-03-10 DIAGNOSIS — U071 COVID-19: Secondary | ICD-10-CM | POA: Diagnosis not present

## 2022-03-10 DIAGNOSIS — Z7982 Long term (current) use of aspirin: Secondary | ICD-10-CM | POA: Diagnosis not present

## 2022-03-10 DIAGNOSIS — Z794 Long term (current) use of insulin: Secondary | ICD-10-CM | POA: Diagnosis not present

## 2022-03-10 DIAGNOSIS — E119 Type 2 diabetes mellitus without complications: Secondary | ICD-10-CM | POA: Diagnosis not present

## 2022-04-19 DIAGNOSIS — E1165 Type 2 diabetes mellitus with hyperglycemia: Secondary | ICD-10-CM | POA: Diagnosis not present

## 2022-04-19 DIAGNOSIS — E782 Mixed hyperlipidemia: Secondary | ICD-10-CM | POA: Diagnosis not present

## 2022-04-19 DIAGNOSIS — I10 Essential (primary) hypertension: Secondary | ICD-10-CM | POA: Diagnosis not present

## 2022-05-08 DIAGNOSIS — I10 Essential (primary) hypertension: Secondary | ICD-10-CM | POA: Diagnosis not present

## 2022-05-08 DIAGNOSIS — M79671 Pain in right foot: Secondary | ICD-10-CM | POA: Diagnosis not present

## 2022-05-08 DIAGNOSIS — Z91199 Patient's noncompliance with other medical treatment and regimen due to unspecified reason: Secondary | ICD-10-CM | POA: Diagnosis not present

## 2022-05-08 DIAGNOSIS — G5750 Tarsal tunnel syndrome, unspecified lower limb: Secondary | ICD-10-CM | POA: Diagnosis not present

## 2022-05-08 DIAGNOSIS — E1165 Type 2 diabetes mellitus with hyperglycemia: Secondary | ICD-10-CM | POA: Diagnosis not present

## 2022-05-08 DIAGNOSIS — E1140 Type 2 diabetes mellitus with diabetic neuropathy, unspecified: Secondary | ICD-10-CM | POA: Diagnosis not present

## 2022-06-04 DIAGNOSIS — R809 Proteinuria, unspecified: Secondary | ICD-10-CM | POA: Diagnosis not present

## 2022-06-04 DIAGNOSIS — R8290 Unspecified abnormal findings in urine: Secondary | ICD-10-CM | POA: Diagnosis not present

## 2022-06-04 DIAGNOSIS — I10 Essential (primary) hypertension: Secondary | ICD-10-CM | POA: Diagnosis not present

## 2022-06-04 DIAGNOSIS — N189 Chronic kidney disease, unspecified: Secondary | ICD-10-CM | POA: Diagnosis not present

## 2022-06-06 DIAGNOSIS — E1122 Type 2 diabetes mellitus with diabetic chronic kidney disease: Secondary | ICD-10-CM | POA: Diagnosis not present

## 2022-06-06 DIAGNOSIS — N189 Chronic kidney disease, unspecified: Secondary | ICD-10-CM | POA: Diagnosis not present

## 2022-06-06 DIAGNOSIS — I10 Essential (primary) hypertension: Secondary | ICD-10-CM | POA: Diagnosis not present

## 2022-06-06 DIAGNOSIS — R809 Proteinuria, unspecified: Secondary | ICD-10-CM | POA: Diagnosis not present

## 2022-06-06 DIAGNOSIS — R8290 Unspecified abnormal findings in urine: Secondary | ICD-10-CM | POA: Diagnosis not present

## 2022-06-07 ENCOUNTER — Other Ambulatory Visit (HOSPITAL_COMMUNITY): Payer: Self-pay | Admitting: Nephrology

## 2022-06-07 DIAGNOSIS — I10 Essential (primary) hypertension: Secondary | ICD-10-CM

## 2022-06-07 DIAGNOSIS — E1122 Type 2 diabetes mellitus with diabetic chronic kidney disease: Secondary | ICD-10-CM

## 2022-06-07 DIAGNOSIS — R809 Proteinuria, unspecified: Secondary | ICD-10-CM

## 2022-07-04 DIAGNOSIS — R600 Localized edema: Secondary | ICD-10-CM | POA: Diagnosis not present

## 2022-07-04 DIAGNOSIS — N189 Chronic kidney disease, unspecified: Secondary | ICD-10-CM | POA: Diagnosis not present

## 2022-07-04 DIAGNOSIS — I10 Essential (primary) hypertension: Secondary | ICD-10-CM | POA: Diagnosis not present

## 2022-07-04 DIAGNOSIS — R809 Proteinuria, unspecified: Secondary | ICD-10-CM | POA: Diagnosis not present

## 2022-08-06 DIAGNOSIS — R809 Proteinuria, unspecified: Secondary | ICD-10-CM | POA: Diagnosis not present

## 2022-08-06 DIAGNOSIS — E1122 Type 2 diabetes mellitus with diabetic chronic kidney disease: Secondary | ICD-10-CM | POA: Diagnosis not present

## 2022-08-06 DIAGNOSIS — N189 Chronic kidney disease, unspecified: Secondary | ICD-10-CM | POA: Diagnosis not present

## 2022-08-06 DIAGNOSIS — I10 Essential (primary) hypertension: Secondary | ICD-10-CM | POA: Diagnosis not present

## 2022-08-15 DIAGNOSIS — R809 Proteinuria, unspecified: Secondary | ICD-10-CM | POA: Diagnosis not present

## 2022-08-15 DIAGNOSIS — R600 Localized edema: Secondary | ICD-10-CM | POA: Diagnosis not present

## 2022-08-15 DIAGNOSIS — I10 Essential (primary) hypertension: Secondary | ICD-10-CM | POA: Diagnosis not present

## 2022-08-15 DIAGNOSIS — N189 Chronic kidney disease, unspecified: Secondary | ICD-10-CM | POA: Diagnosis not present

## 2022-08-26 DIAGNOSIS — R5383 Other fatigue: Secondary | ICD-10-CM | POA: Diagnosis not present

## 2022-08-26 DIAGNOSIS — E1165 Type 2 diabetes mellitus with hyperglycemia: Secondary | ICD-10-CM | POA: Diagnosis not present

## 2022-08-26 DIAGNOSIS — I10 Essential (primary) hypertension: Secondary | ICD-10-CM | POA: Diagnosis not present

## 2022-10-07 DIAGNOSIS — M79671 Pain in right foot: Secondary | ICD-10-CM | POA: Diagnosis not present

## 2022-10-07 DIAGNOSIS — G5750 Tarsal tunnel syndrome, unspecified lower limb: Secondary | ICD-10-CM | POA: Diagnosis not present

## 2023-01-10 DIAGNOSIS — R519 Headache, unspecified: Secondary | ICD-10-CM | POA: Diagnosis not present

## 2023-01-10 DIAGNOSIS — S0003XA Contusion of scalp, initial encounter: Secondary | ICD-10-CM | POA: Diagnosis not present

## 2023-01-10 DIAGNOSIS — L739 Follicular disorder, unspecified: Secondary | ICD-10-CM | POA: Diagnosis not present

## 2023-01-10 DIAGNOSIS — S0990XA Unspecified injury of head, initial encounter: Secondary | ICD-10-CM | POA: Diagnosis not present

## 2023-01-10 DIAGNOSIS — E785 Hyperlipidemia, unspecified: Secondary | ICD-10-CM | POA: Diagnosis not present

## 2023-01-10 DIAGNOSIS — I10 Essential (primary) hypertension: Secondary | ICD-10-CM | POA: Diagnosis not present

## 2023-01-10 DIAGNOSIS — W19XXXA Unspecified fall, initial encounter: Secondary | ICD-10-CM | POA: Diagnosis not present

## 2023-01-10 DIAGNOSIS — E119 Type 2 diabetes mellitus without complications: Secondary | ICD-10-CM | POA: Diagnosis not present

## 2023-01-10 DIAGNOSIS — Z90710 Acquired absence of both cervix and uterus: Secondary | ICD-10-CM | POA: Diagnosis not present

## 2023-01-23 DIAGNOSIS — R1013 Epigastric pain: Secondary | ICD-10-CM | POA: Diagnosis not present

## 2023-01-23 DIAGNOSIS — E1165 Type 2 diabetes mellitus with hyperglycemia: Secondary | ICD-10-CM | POA: Diagnosis not present

## 2023-01-23 DIAGNOSIS — Z905 Acquired absence of kidney: Secondary | ICD-10-CM | POA: Diagnosis not present

## 2023-01-23 DIAGNOSIS — E785 Hyperlipidemia, unspecified: Secondary | ICD-10-CM | POA: Diagnosis not present

## 2023-01-23 DIAGNOSIS — Z794 Long term (current) use of insulin: Secondary | ICD-10-CM | POA: Diagnosis not present

## 2023-01-23 DIAGNOSIS — K8500 Idiopathic acute pancreatitis without necrosis or infection: Secondary | ICD-10-CM | POA: Diagnosis not present

## 2023-01-23 DIAGNOSIS — I10 Essential (primary) hypertension: Secondary | ICD-10-CM | POA: Diagnosis not present

## 2023-01-23 DIAGNOSIS — I700 Atherosclerosis of aorta: Secondary | ICD-10-CM | POA: Diagnosis not present

## 2023-01-23 DIAGNOSIS — Z85528 Personal history of other malignant neoplasm of kidney: Secondary | ICD-10-CM | POA: Diagnosis not present

## 2023-01-23 DIAGNOSIS — Z7982 Long term (current) use of aspirin: Secondary | ICD-10-CM | POA: Diagnosis not present

## 2023-01-23 DIAGNOSIS — K5900 Constipation, unspecified: Secondary | ICD-10-CM | POA: Diagnosis not present

## 2023-01-23 DIAGNOSIS — K859 Acute pancreatitis without necrosis or infection, unspecified: Secondary | ICD-10-CM | POA: Diagnosis not present

## 2023-01-23 DIAGNOSIS — E669 Obesity, unspecified: Secondary | ICD-10-CM | POA: Diagnosis not present

## 2023-01-23 DIAGNOSIS — R0789 Other chest pain: Secondary | ICD-10-CM | POA: Diagnosis not present

## 2023-01-23 DIAGNOSIS — K5730 Diverticulosis of large intestine without perforation or abscess without bleeding: Secondary | ICD-10-CM | POA: Diagnosis not present

## 2023-01-23 DIAGNOSIS — R791 Abnormal coagulation profile: Secondary | ICD-10-CM | POA: Diagnosis not present

## 2023-01-23 DIAGNOSIS — K760 Fatty (change of) liver, not elsewhere classified: Secondary | ICD-10-CM | POA: Diagnosis not present

## 2023-01-23 DIAGNOSIS — K5790 Diverticulosis of intestine, part unspecified, without perforation or abscess without bleeding: Secondary | ICD-10-CM | POA: Diagnosis not present

## 2023-01-23 DIAGNOSIS — Z90710 Acquired absence of both cervix and uterus: Secondary | ICD-10-CM | POA: Diagnosis not present

## 2023-01-23 DIAGNOSIS — R9431 Abnormal electrocardiogram [ECG] [EKG]: Secondary | ICD-10-CM | POA: Diagnosis not present

## 2023-01-23 DIAGNOSIS — K700 Alcoholic fatty liver: Secondary | ICD-10-CM | POA: Diagnosis not present

## 2023-02-05 DIAGNOSIS — I10 Essential (primary) hypertension: Secondary | ICD-10-CM | POA: Diagnosis not present

## 2023-02-05 DIAGNOSIS — E1165 Type 2 diabetes mellitus with hyperglycemia: Secondary | ICD-10-CM | POA: Diagnosis not present

## 2023-02-05 DIAGNOSIS — E1140 Type 2 diabetes mellitus with diabetic neuropathy, unspecified: Secondary | ICD-10-CM | POA: Diagnosis not present

## 2023-02-05 DIAGNOSIS — R1013 Epigastric pain: Secondary | ICD-10-CM | POA: Diagnosis not present

## 2023-02-06 ENCOUNTER — Encounter: Payer: Self-pay | Admitting: Internal Medicine

## 2023-02-09 ENCOUNTER — Encounter (INDEPENDENT_AMBULATORY_CARE_PROVIDER_SITE_OTHER): Payer: Self-pay

## 2023-02-10 ENCOUNTER — Encounter: Payer: Self-pay | Admitting: Gastroenterology

## 2023-02-10 ENCOUNTER — Ambulatory Visit (INDEPENDENT_AMBULATORY_CARE_PROVIDER_SITE_OTHER): Payer: BC Managed Care – PPO | Admitting: Gastroenterology

## 2023-02-10 VITALS — BP 150/77 | HR 64 | Temp 98.5°F | Ht 68.0 in | Wt 251.0 lb

## 2023-02-10 DIAGNOSIS — K760 Fatty (change of) liver, not elsewhere classified: Secondary | ICD-10-CM | POA: Diagnosis not present

## 2023-02-10 DIAGNOSIS — R9341 Abnormal radiologic findings on diagnostic imaging of renal pelvis, ureter, or bladder: Secondary | ICD-10-CM | POA: Diagnosis not present

## 2023-02-10 DIAGNOSIS — R7989 Other specified abnormal findings of blood chemistry: Secondary | ICD-10-CM | POA: Insufficient documentation

## 2023-02-10 DIAGNOSIS — R1013 Epigastric pain: Secondary | ICD-10-CM | POA: Insufficient documentation

## 2023-02-10 DIAGNOSIS — R748 Abnormal levels of other serum enzymes: Secondary | ICD-10-CM | POA: Insufficient documentation

## 2023-02-10 NOTE — Patient Instructions (Signed)
 Please complete labs, fasting, nothing to eat or drink after midnight. Please complete ultrasound. Work towards better control of your diabetes.  Call with recurrent abdominal pain.

## 2023-02-10 NOTE — Progress Notes (Signed)
 GI Office Note    Referring Provider: Lari Elspeth BRAVO, MD Primary Care Physician:  Lari Elspeth BRAVO, MD  Primary Gastroenterologist: Carlin POUR. Cindie, DO   Chief Complaint   Chief Complaint  Patient presents with   Abdominal Pain     History of Present Illness   Sandra Terrell is a 58 y.o. female presenting today at the request of Izetta Raymond, PA-C for epigastric pain.   Patient states the week of Christmas, she had acute onset epigastric pain associated with nausea, with radiation into her back. This prompted ED visit. Work up as below. Symptoms lasted for several days. Finally saw PCP about week after symptoms resolved. Follow up labs as below. She denies any recent medication changes. No etoh use. History of high cholesterol but she is not sure about triglycerides. Her DM is poorly controlled. She denies any GLP1s. No FH pancreatitis.   She has FH of colon cancer, father succumbed to illness at age 67. She has had several colonoscopies, she is unsure when her last one was.   02/06/23:  tbili 0.2, AP 101, AST 37, ALT 35H, lipase 156H, A1c 13H  12/2022: White blood cell count 9600, hemoglobin 13.2, MCV 80.8, platelets 255,000, sodium 138, potassium 3.6, BUN 17, creatinine 1.28, lipase 784, D-dimer 1758, tbili 0.4, alkaline phosphatase 192, AST 18, ALT 25, albumin 3.2.  CT chest/abd/pelvis:  1. No acute chest CT findings.  2. Aortic atherosclerosis.  3. Mildly enlarged steatotic liver.  4. Constipation and diverticulosis.  5. Bladder wall thickening, greater anteriorly where there is  perivesical stranding. Findings most likely indicate active  cystitis. Follow-up cystoscopy suggested due to the asymmetry in the  thickening.  6. Postsurgical wedge resection changes outer lower pole right  kidney. The prior report describes a mass having been removed at  this location. No mass enhancement in either kidney.  7. DISH.   Last colonoscopy???2018: op note not  available but path showed 6 hyperplastic polyps removed. Patient believes she has had colonoscopy since then, advised her she needs them every five years due to family history.   Medications   Current Outpatient Medications  Medication Sig Dispense Refill   amLODipine (NORVASC) 10 MG tablet Take 1 tablet by mouth daily.     DULoxetine  (CYMBALTA ) 60 MG capsule Take 1 capsule (60 mg total) by mouth daily. 30 capsule 11   gabapentin (NEURONTIN) 300 MG capsule Take 300 mg by mouth 3 (three) times daily.     glipiZIDE (GLUCOTROL) 5 MG tablet Take by mouth.     hydrochlorothiazide (HYDRODIURIL) 25 MG tablet Take 25 mg by mouth daily.     LANTUS  100 UNIT/ML injection      losartan (COZAAR) 100 MG tablet Take 100 mg by mouth daily.     metoprolol tartrate (LOPRESSOR) 25 MG tablet Take 25 mg by mouth 2 (two) times daily.     rosuvastatin (CRESTOR) 10 MG tablet SMARTSIG:1 Tablet(s) By Mouth Every Evening     No current facility-administered medications for this visit.    Allergies   Allergies as of 02/10/2023 - Review Complete 02/10/2023  Allergen Reaction Noted   Lisinopril Swelling 04/13/2014    Past Medical History   Past Medical History:  Diagnosis Date   Arthritis    CKD (chronic kidney disease), stage II    Diabetes mellitus without complication (HCC)    History of kidney cancer    Hyperlipidemia    Hypertension    not taking medications  Past Surgical History   Past Surgical History:  Procedure Laterality Date   ABDOMINAL HYSTERECTOMY     CARPAL TUNNEL RELEASE Left    CARPAL TUNNEL RELEASE Right 11/15/2019   Procedure: RIGHT CARPAL TUNNEL RELEASE;  Surgeon: Barbarann Oneil BROCKS, MD;  Location: [facility];  Service: Orthopedics;  Laterality: Right;   ROBOTIC ASSITED PARTIAL NEPHRECTOMY Right 08/25/2015   Procedure: XI ROBOTIC ASSITED RIGHT PARTIAL NEPHRECTOMY;  Surgeon: Belvie LITTIE Clara, MD;  Location: [facility];  Service: Urology;  Laterality: Right;   TRIGGER  FINGER RELEASE Left 11/15/2019   Procedure: LEFT LONG TRIGGER FINGER RELEASE;  Surgeon: Barbarann Oneil BROCKS, MD;  Location: [facility];  Service: Orthopedics;  Laterality: Left;    Past Family History   Family History  Problem Relation Age of Onset   Diabetes Mother    Hypertension Mother    Cancer Father        colon cancer age 54   Diabetes Sister    Hypertension Sister    Pancreatic cancer Neg Hx     Past Social History   Social History   Socioeconomic History   Marital status: Single    Spouse name: Not on file   Number of children: Not on file   Years of education: Not on file   Highest education level: Not on file  Occupational History   Not on file  Tobacco Use   Smoking status: Never   Smokeless tobacco: Never  Substance and Sexual Activity   Alcohol  use: No   Drug use: No   Sexual activity: Yes    Birth control/protection: Surgical  Other Topics Concern   Not on file  Social History Narrative   Not on file   Social Drivers of Health   Financial Resource Strain: Not on file  Food Insecurity: Not on file  Transportation Needs: Not on file  Physical Activity: Not on file  Stress: Not on file  Social Connections: Not on file  Intimate Partner Violence: Not on file    Review of Systems   General: Negative for anorexia, weight loss, fever, chills, fatigue, weakness. Eyes: Negative for vision changes.  ENT: Negative for hoarseness, difficulty swallowing , nasal congestion. CV: Negative for chest pain, angina, palpitations, dyspnea on exertion, peripheral edema.  Respiratory: Negative for dyspnea at rest, dyspnea on exertion, cough, sputum, wheezing.  GI: See history of present illness. GU:  Negative for dysuria, hematuria, urinary incontinence, urinary frequency, nocturnal urination.  MS: Negative for joint pain, low back pain.  Derm: Negative for rash or itching.  Neuro: Negative for weakness, abnormal sensation, seizure, frequent headaches,  memory loss,  confusion.  Psych: Negative for anxiety, depression, suicidal ideation, hallucinations.  Endo: Negative for unusual weight change.  Heme: Negative for bruising or bleeding. Allergy: Negative for rash or hives.  Physical Exam   BP (!) 150/77 (BP Location: Right Arm, Patient Position: Sitting, Cuff Size: Large)   Pulse 64   Temp 98.5 F (36.9 C) (Oral)   Ht 5' 8 (1.727 m)   Wt 251 lb (113.9 kg)   SpO2 99%   BMI 38.16 kg/m    General: Well-nourished, well-developed in no acute distress.  Head: Normocephalic, atraumatic.   Eyes: Conjunctiva pink, no icterus. Mouth: Oropharyngeal mucosa moist and pink   Neck: Supple without thyromegaly, masses, or lymphadenopathy.  Lungs: Clear to auscultation bilaterally.  Heart: Regular rate and rhythm, no murmurs rubs or gallops.  Abdomen: Bowel sounds are normal, nontender, nondistended, no  hepatosplenomegaly or masses,  no abdominal bruits or hernia, no rebound or guarding.   Rectal: not performed Extremities: No lower extremity edema. No clubbing or deformities.  Neuro: Alert and oriented x 4 , grossly normal neurologically.  Skin: Warm and dry, no rash or jaundice.   Psych: Alert and cooperative, normal mood and affect.  Labs   See hpi  Imaging Studies   No results found.  Assessment/Plan:   Epigastric pain: acute onset symptoms lasting for 3 days, concerning for acute pancreatitis, lipase was over 700. Pancreas ok by CT but could be that she presented early on and inflammatory process not yet detected on CT. Her ALT bumped up at one week out, was normal day of presentation. Cannot exclude biliary pancreatitis. Will check triglycerides as well. Calcium level was normal. No etoh history. No FH of pancreatitis.  -triglycerides, IgG4, LFTs, lipase -RUQ U/S  Elevated D-dimer: -clinically no symptoms of DVT, PE -repeat D-dimer   Fatty liver: mildly enlarged liver -needs better glycemic control -strive towards  healthier weight -daily exercise  Bladder wall thickening on CT: greater anteriorly where there is  perivesical stranding. Possible cystitis but clinically no symptoms. Consider follow up with urology for cystoscopy due to the asymmetry in the thickening.   Sonny RAMAN. Ezzard, MHS, PA-C [facility]

## 2023-02-11 DIAGNOSIS — R748 Abnormal levels of other serum enzymes: Secondary | ICD-10-CM | POA: Diagnosis not present

## 2023-02-11 DIAGNOSIS — R7989 Other specified abnormal findings of blood chemistry: Secondary | ICD-10-CM | POA: Diagnosis not present

## 2023-02-11 DIAGNOSIS — R1013 Epigastric pain: Secondary | ICD-10-CM | POA: Diagnosis not present

## 2023-02-12 LAB — D-DIMER, QUANTITATIVE: D-DIMER: 0.73 mg{FEU}/L — ABNORMAL HIGH (ref 0.00–0.49)

## 2023-02-12 LAB — HEPATIC FUNCTION PANEL
ALT: 28 [IU]/L (ref 0–32)
AST: 21 [IU]/L (ref 0–40)
Albumin: 4 g/dL (ref 3.8–4.9)
Alkaline Phosphatase: 95 [IU]/L (ref 44–121)
Bilirubin Total: 0.2 mg/dL (ref 0.0–1.2)
Bilirubin, Direct: 0.09 mg/dL (ref 0.00–0.40)
Total Protein: 7 g/dL (ref 6.0–8.5)

## 2023-02-12 LAB — TRIGLYCERIDES: Triglycerides: 136 mg/dL (ref 0–149)

## 2023-02-12 LAB — LIPASE: Lipase: 166 U/L — ABNORMAL HIGH (ref 14–72)

## 2023-02-12 LAB — IGG 4: IgG, Subclass 4: 59 mg/dL (ref 2–96)

## 2023-02-17 ENCOUNTER — Ambulatory Visit (HOSPITAL_COMMUNITY)
Admission: RE | Admit: 2023-02-17 | Discharge: 2023-02-17 | Disposition: A | Discharge status: Home / Self Care | Payer: BC Managed Care – PPO | Source: Ambulatory Visit | Attending: Gastroenterology | Admitting: Gastroenterology

## 2023-02-17 DIAGNOSIS — R748 Abnormal levels of other serum enzymes: Secondary | ICD-10-CM | POA: Insufficient documentation

## 2023-02-17 DIAGNOSIS — R1013 Epigastric pain: Secondary | ICD-10-CM | POA: Diagnosis not present

## 2023-02-17 DIAGNOSIS — R7989 Other specified abnormal findings of blood chemistry: Secondary | ICD-10-CM | POA: Insufficient documentation

## 2023-02-17 DIAGNOSIS — Z0389 Encounter for observation for other suspected diseases and conditions ruled out: Secondary | ICD-10-CM | POA: Diagnosis not present

## 2023-02-20 ENCOUNTER — Other Ambulatory Visit: Payer: Self-pay | Admitting: *Deleted

## 2023-02-20 DIAGNOSIS — K8500 Idiopathic acute pancreatitis without necrosis or infection: Secondary | ICD-10-CM

## 2023-02-20 DIAGNOSIS — R9389 Abnormal findings on diagnostic imaging of other specified body structures: Secondary | ICD-10-CM

## 2023-02-20 NOTE — Addendum Note (Signed)
Addended by: Armstead Peaks on: 02/20/2023 02:18 PM   Modules accepted: Orders

## 2023-02-27 ENCOUNTER — Ambulatory Visit (HOSPITAL_COMMUNITY)
Admission: RE | Admit: 2023-02-27 | Discharge: 2023-02-27 | Disposition: A | Discharge status: Home / Self Care | Payer: BC Managed Care – PPO | Source: Ambulatory Visit | Attending: Gastroenterology | Admitting: Gastroenterology

## 2023-02-27 ENCOUNTER — Other Ambulatory Visit (HOSPITAL_COMMUNITY): Payer: Self-pay | Admitting: Gastroenterology

## 2023-02-27 DIAGNOSIS — K8500 Idiopathic acute pancreatitis without necrosis or infection: Secondary | ICD-10-CM | POA: Insufficient documentation

## 2023-02-27 DIAGNOSIS — Z905 Acquired absence of kidney: Secondary | ICD-10-CM | POA: Diagnosis not present

## 2023-02-27 MED ORDER — GADOBUTROL 1 MMOL/ML IV SOLN
9.0000 mL | Freq: Once | INTRAVENOUS | Status: AC | PRN
Start: 2023-02-27 — End: 2023-02-27
  Administered 2023-02-27: 9 mL via INTRAVENOUS

## 2023-03-13 ENCOUNTER — Ambulatory Visit: Payer: BC Managed Care – PPO | Admitting: Urology

## 2023-04-16 ENCOUNTER — Ambulatory Visit: Payer: BC Managed Care – PPO | Admitting: Urology

## 2023-04-16 VITALS — BP 169/78 | HR 71

## 2023-04-16 DIAGNOSIS — N3289 Other specified disorders of bladder: Secondary | ICD-10-CM

## 2023-04-16 LAB — URINALYSIS, ROUTINE W REFLEX MICROSCOPIC
Bilirubin, UA: NEGATIVE
Ketones, UA: NEGATIVE
Leukocytes,UA: NEGATIVE
Nitrite, UA: NEGATIVE
RBC, UA: NEGATIVE
Specific Gravity, UA: 1.02 (ref 1.005–1.030)
Urobilinogen, Ur: 0.2 mg/dL (ref 0.2–1.0)
pH, UA: 7 (ref 5.0–7.5)

## 2023-04-16 LAB — MICROSCOPIC EXAMINATION: Bacteria, UA: NONE SEEN

## 2023-04-16 MED ORDER — CIPROFLOXACIN HCL 500 MG PO TABS
500.0000 mg | ORAL_TABLET | Freq: Once | ORAL | Status: AC
  Administered 2023-04-16: 500 mg via ORAL

## 2023-04-16 NOTE — Progress Notes (Signed)
 04/16/2023 8:52 AM   Sandra Terrell April 13, 1965 [id number]  Referring provider: Tiffany Kocher, PA-C [address] [place],  [place] [address]  Bladder wall thickening    HPI: Sandra Terrell is a 58yo here for evaluation of bladder wall thickening. She was seen in the ER 12/2022 for suprapubic pain and underwent CT which showed anterior bladder wall thickening. She was not treated for a UTI. She is a Engineer, manufacturing systems. No tobacco abuse hx. No gross hematuria or dysuria.    PMH: Past Medical History:  Diagnosis Date   Arthritis    CKD (chronic kidney disease), stage II    Diabetes mellitus without complication (HCC)    History of kidney cancer    Hyperlipidemia    Hypertension    not taking medications    Surgical History: Past Surgical History:  Procedure Laterality Date   ABDOMINAL HYSTERECTOMY     CARPAL TUNNEL RELEASE Left    CARPAL TUNNEL RELEASE Right 11/15/2019   Procedure: RIGHT CARPAL TUNNEL RELEASE;  Surgeon: Eldred Manges, MD;  Location: [facility];  Service: Orthopedics;  Laterality: Right;   ROBOTIC ASSITED PARTIAL NEPHRECTOMY Right 08/25/2015   Procedure: XI ROBOTIC ASSITED RIGHT PARTIAL NEPHRECTOMY;  Surgeon: Malen Gauze, MD;  Location: [facility];  Service: Urology;  Laterality: Right;   TRIGGER FINGER RELEASE Left 11/15/2019   Procedure: LEFT LONG TRIGGER FINGER RELEASE;  Surgeon: Eldred Manges, MD;  Location: [facility];  Service: Orthopedics;  Laterality: Left;    Home Medications:  Allergies as of 04/16/2023       Reactions   Lisinopril Swelling   angioedema        Medication List        Accurate as of April 16, 2023  8:52 AM. If you have any questions, ask your nurse or doctor.          amLODipine 10 MG tablet Commonly known as: NORVASC Take 1 tablet by mouth daily.   DULoxetine 60 MG capsule Commonly known as: Cymbalta Take 1 capsule (60 mg total) by mouth daily.   gabapentin 300 MG  capsule Commonly known as: NEURONTIN Take 300 mg by mouth 3 (three) times daily.   glipiZIDE 5 MG tablet Commonly known as: GLUCOTROL Take by mouth.   hydrochlorothiazide 25 MG tablet Commonly known as: HYDRODIURIL Take 25 mg by mouth daily.   Lantus 100 UNIT/ML injection Generic drug: insulin glargine   losartan 100 MG tablet Commonly known as: COZAAR Take 100 mg by mouth daily.   metoprolol tartrate 25 MG tablet Commonly known as: LOPRESSOR Take 25 mg by mouth 2 (two) times daily.   rosuvastatin 10 MG tablet Commonly known as: CRESTOR SMARTSIG:1 Tablet(s) By Mouth Every Evening        Allergies:  Allergies  Allergen Reactions   Lisinopril Swelling    angioedema    Family History: Family History  Problem Relation Age of Onset   Diabetes Mother    Hypertension Mother    Cancer Father        colon cancer age 35   Diabetes Sister    Hypertension Sister    Pancreatic cancer Neg Hx     Social History:  reports that she has never smoked. She has never used smokeless tobacco. She reports that she does not drink alcohol and does not use drugs.  ROS: All other review of systems were reviewed and are negative except what is noted above in HPI  Physical Exam: BP (!) 169/78   Pulse 71   Constitutional:  Alert and oriented, No acute distress. HEENT: [place] AT, moist mucus membranes.  Trachea midline, no masses. Cardiovascular: No clubbing, cyanosis, or edema. Respiratory: Normal respiratory effort, no increased work of breathing. GI: Abdomen is soft, nontender, nondistended, no abdominal masses GU: No CVA tenderness.  Lymph: No cervical or inguinal lymphadenopathy. Skin: No rashes, bruises or suspicious lesions. Neurologic: Grossly intact, no focal deficits, moving all 4 extremities. Psychiatric: Normal mood and affect.  Laboratory Data: Lab Results  Component Value Date   WBC 9.1 08/22/2015   HGB 11.7 (L) 08/27/2015   HCT 38.1 08/27/2015   MCV 81.8  08/22/2015   PLT 219 08/22/2015    Lab Results  Component Value Date   CREATININE 0.90 11/11/2019    No results found for: "PSA"  No results found for: "TESTOSTERONE"  Lab Results  Component Value Date   HGBA1C 12.0 (H) 02/21/2021    Urinalysis    Component Value Date/Time   COLORURINE YELLOW 08/27/2015 0504   APPEARANCEUR CLEAR 08/27/2015 0504   LABSPEC 1.017 08/27/2015 0504   PHURINE 5.5 08/27/2015 0504   GLUCOSEU 100 (A) 08/27/2015 0504   HGBUR NEGATIVE 08/27/2015 0504   BILIRUBINUR NEGATIVE 08/27/2015 0504   KETONESUR 15 (A) 08/27/2015 0504   PROTEINUR NEGATIVE 08/27/2015 0504   NITRITE NEGATIVE 08/27/2015 0504   LEUKOCYTESUR NEGATIVE 08/27/2015 0504    No results found for: "LABMICR", "WBCUA", "RBCUA", "LABEPIT", "MUCUS", "BACTERIA"  Pertinent Imaging:  No results found for this or any previous visit.  No results found for this or any previous visit.  No results found for this or any previous visit.  No results found for this or any previous visit.  No results found for this or any previous visit.  No results found for this or any previous visit.  No results found for this or any previous visit.  No results found for this or any previous visit.    Cystoscopy Procedure Note  Patient identification was confirmed, informed consent was obtained, and patient was prepped using Betadine solution.  Lidocaine jelly was administered per urethral meatus.    Procedure: - Flexible cystoscope introduced, without any difficulty.   - Thorough search of the bladder revealed:    normal urethral meatus    normal urothelium    no stones    no ulcers     no tumors    no urethral polyps    no trabeculation  - Ureteral orifices were normal in position and appearance.  Post-Procedure: - Patient tolerated the procedure well      Assessment & Plan:    1. Bladder wall thickening (Primary) Uirne for cytology. If cytology is negative the patient can followup  prn - Urinalysis, Routine w reflex microscopic   No follow-ups on file.  Wilkie Aye, MD  [facility] 

## 2023-04-17 LAB — CYTOLOGY, URINE

## 2023-04-22 ENCOUNTER — Encounter: Payer: Self-pay | Admitting: Urology

## 2023-05-12 NOTE — Progress Notes (Deleted)
 GI Office Note    Referring Provider: Juliette Alcide, MD Primary Care Physician:  Juliette Alcide, MD  Primary Gastroenterologist: Hennie Duos. Marletta Lor, DO   Chief Complaint   No chief complaint on file.   History of Present Illness   Sandra Terrell is a 58 y.o. female presenting today for follow up. Last seen 01/2023 for epigastric pain. Due for colonoscopy. ***    She has FH of colon cancer, father succumbed to illness at age 43. She has had several colonoscopies, she is unsure when her last one was.    02/06/23:  tbili 0.2, AP 101, AST 37, ALT 35H, lipase 156H, A1c 13H   12/2022: White blood cell count 9600, hemoglobin 13.2, MCV 80.8, platelets 255,000, sodium 138, potassium 3.6, BUN 17, creatinine 1.28, lipase 784, D-dimer 1758, tbili 0.4, alkaline phosphatase 192, AST 18, ALT 25, albumin 3.2.  01/2023:***   CT chest/abd/pelvis:  1. No acute chest CT findings.  2. Aortic atherosclerosis.  3. Mildly enlarged steatotic liver.  4. Constipation and diverticulosis.  5. Bladder wall thickening, greater anteriorly where there is  perivesical stranding. Findings most likely indicate active  cystitis. Follow-up cystoscopy suggested due to the asymmetry in the  thickening.  6. Postsurgical wedge resection changes outer lower pole right  kidney. The prior report describes a mass having been removed at  this location. No mass enhancement in either kidney.  7. DISH.   MRCP 01/2023: IMPRESSION: 1. Pancreas divisum. No pancreatic ductal dilatation or surrounding inflammatory changes. 2. No evidence of pancreatic mass. 3. Evidence of partial right nephrectomy. No evidence of recurrent or metastatic disease in the abdomen.  Last colonoscopy???2018: op note not available but path showed 6 hyperplastic polyps removed. Patient believes she has had colonoscopy since then, advised her she needs them every five years due to family history.     Medications   Current  Outpatient Medications  Medication Sig Dispense Refill   amLODipine (NORVASC) 10 MG tablet Take 1 tablet by mouth daily.     DULoxetine (CYMBALTA) 60 MG capsule Take 1 capsule (60 mg total) by mouth daily. 30 capsule 11   gabapentin (NEURONTIN) 300 MG capsule Take 300 mg by mouth 3 (three) times daily.     glipiZIDE (GLUCOTROL) 5 MG tablet Take by mouth.     hydrochlorothiazide (HYDRODIURIL) 25 MG tablet Take 25 mg by mouth daily.     LANTUS 100 UNIT/ML injection      losartan (COZAAR) 100 MG tablet Take 100 mg by mouth daily.     metoprolol tartrate (LOPRESSOR) 25 MG tablet Take 25 mg by mouth 2 (two) times daily.     rosuvastatin (CRESTOR) 10 MG tablet SMARTSIG:1 Tablet(s) By Mouth Every Evening     No current facility-administered medications for this visit.    Allergies   Allergies as of 05/13/2023 - Review Complete 04/22/2023  Allergen Reaction Noted   Lisinopril Swelling 04/13/2014     Past Medical History   Past Medical History:  Diagnosis Date   Arthritis    CKD (chronic kidney disease), stage II    Diabetes mellitus without complication (HCC)    History of kidney cancer    Hyperlipidemia    Hypertension    not taking medications    Past Surgical History   Past Surgical History:  Procedure Laterality Date   ABDOMINAL HYSTERECTOMY     CARPAL TUNNEL RELEASE Left    CARPAL TUNNEL RELEASE Right 11/15/2019   Procedure: RIGHT  CARPAL TUNNEL RELEASE;  Surgeon: Adah Acron, MD;  Location: [facility];  Service: Orthopedics;  Laterality: Right;   ROBOTIC ASSITED PARTIAL NEPHRECTOMY Right 08/25/2015   Procedure: XI ROBOTIC ASSITED RIGHT PARTIAL NEPHRECTOMY;  Surgeon: Marco Severs, MD;  Location: [facility];  Service: Urology;  Laterality: Right;   TRIGGER FINGER RELEASE Left 11/15/2019   Procedure: LEFT LONG TRIGGER FINGER RELEASE;  Surgeon: Adah Acron, MD;  Location: [facility];  Service: Orthopedics;  Laterality: Left;    Past Family  History   Family History  Problem Relation Age of Onset   Diabetes Mother    Hypertension Mother    Cancer Father        colon cancer age 34   Diabetes Sister    Hypertension Sister    Pancreatic cancer Neg Hx     Past Social History   Social History   Socioeconomic History   Marital status: Single    Spouse name: Not on file   Number of children: Not on file   Years of education: Not on file   Highest education level: Not on file  Occupational History   Not on file  Tobacco Use   Smoking status: Never   Smokeless tobacco: Never  Substance and Sexual Activity   Alcohol use: No   Drug use: No   Sexual activity: Yes    Birth control/protection: Surgical  Other Topics Concern   Not on file  Social History Narrative   Not on file   Social Drivers of Health   Financial Resource Strain: Not on file  Food Insecurity: Not on file  Transportation Needs: Not on file  Physical Activity: Not on file  Stress: Not on file  Social Connections: Not on file  Intimate Partner Violence: Not on file    Review of Systems   General: Negative for anorexia, weight loss, fever, chills, fatigue, weakness. ENT: Negative for hoarseness, difficulty swallowing , nasal congestion. CV: Negative for chest pain, angina, palpitations, dyspnea on exertion, peripheral edema.  Respiratory: Negative for dyspnea at rest, dyspnea on exertion, cough, sputum, wheezing.  GI: See history of present illness. GU:  Negative for dysuria, hematuria, urinary incontinence, urinary frequency, nocturnal urination.  Endo: Negative for unusual weight change.     Physical Exam   There were no vitals taken for this visit.   General: Well-nourished, well-developed in no acute distress.  Eyes: No icterus. Mouth: Oropharyngeal mucosa moist and pink , no lesions erythema or exudate. Lungs: Clear to auscultation bilaterally.  Heart: Regular rate and rhythm, no murmurs rubs or gallops.  Abdomen: Bowel sounds are  normal, nontender, nondistended, no hepatosplenomegaly or masses,  no abdominal bruits or hernia , no rebound or guarding.  Rectal: ***  Extremities: No lower extremity edema. No clubbing or deformities. Neuro: Alert and oriented x 4   Skin: Warm and dry, no jaundice.   Psych: Alert and cooperative, normal mood and affect.  Labs   *** Imaging Studies   No results found.  Assessment       PLAN   ***   Trudie Fuse. Harles Lied, MHS, PA-C [facility]

## 2023-05-13 ENCOUNTER — Ambulatory Visit: Payer: BC Managed Care – PPO | Admitting: Gastroenterology

## 2023-05-29 ENCOUNTER — Ambulatory Visit: Payer: BC Managed Care – PPO | Admitting: Nurse Practitioner

## 2023-09-01 ENCOUNTER — Ambulatory Visit: Admitting: Nurse Practitioner
